# Patient Record
Sex: Female | Born: 1945 | Race: White | Hispanic: No | Marital: Married | Smoking: Former smoker
Health system: Southern US, Community
[De-identification: ages and names within clinical notes are randomized; demographics above are authoritative.]

## PROBLEM LIST (undated history)

## (undated) DIAGNOSIS — G629 Polyneuropathy, unspecified: Secondary | ICD-10-CM

## (undated) DIAGNOSIS — M797 Fibromyalgia: Secondary | ICD-10-CM

## (undated) DIAGNOSIS — E782 Mixed hyperlipidemia: Secondary | ICD-10-CM

## (undated) DIAGNOSIS — D649 Anemia, unspecified: Secondary | ICD-10-CM

## (undated) DIAGNOSIS — R011 Cardiac murmur, unspecified: Secondary | ICD-10-CM

## (undated) DIAGNOSIS — K219 Gastro-esophageal reflux disease without esophagitis: Secondary | ICD-10-CM

## (undated) DIAGNOSIS — I10 Essential (primary) hypertension: Secondary | ICD-10-CM

## (undated) DIAGNOSIS — I5189 Other ill-defined heart diseases: Secondary | ICD-10-CM

## (undated) DIAGNOSIS — J45909 Unspecified asthma, uncomplicated: Secondary | ICD-10-CM

## (undated) DIAGNOSIS — I6522 Occlusion and stenosis of left carotid artery: Secondary | ICD-10-CM

## (undated) DIAGNOSIS — R0600 Dyspnea, unspecified: Secondary | ICD-10-CM

## (undated) DIAGNOSIS — G4730 Sleep apnea, unspecified: Secondary | ICD-10-CM

## (undated) DIAGNOSIS — E119 Type 2 diabetes mellitus without complications: Secondary | ICD-10-CM

## (undated) DIAGNOSIS — R0609 Other forms of dyspnea: Secondary | ICD-10-CM

## (undated) DIAGNOSIS — G9050 Complex regional pain syndrome I, unspecified: Secondary | ICD-10-CM

## (undated) HISTORY — PX: BACK SURGERY: SHX140

## (undated) HISTORY — PX: FRACTURE SURGERY: SHX138

## (undated) HISTORY — PX: CARPAL TUNNEL RELEASE: SHX101

## (undated) HISTORY — PX: ABDOMINAL HYSTERECTOMY: SHX81

## (undated) HISTORY — PX: ANKLE FRACTURE SURGERY: SHX122

## (undated) HISTORY — PX: COLONOSCOPY: SHX174

## (undated) HISTORY — PX: KNEE ARTHROSCOPY: SHX127

## (undated) HISTORY — PX: CATARACT EXTRACTION, BILATERAL: SHX1313

---

## 2016-07-20 HISTORY — PX: FEMUR FRACTURE SURGERY: SHX633

## 2017-01-01 ENCOUNTER — Other Ambulatory Visit (HOSPITAL_COMMUNITY): Payer: Self-pay | Admitting: Interventional Radiology

## 2017-01-01 DIAGNOSIS — M4850XA Collapsed vertebra, not elsewhere classified, site unspecified, initial encounter for fracture: Principal | ICD-10-CM

## 2017-01-01 DIAGNOSIS — IMO0001 Reserved for inherently not codable concepts without codable children: Secondary | ICD-10-CM

## 2017-01-06 ENCOUNTER — Ambulatory Visit (HOSPITAL_COMMUNITY): Admission: RE | Admit: 2017-01-06 | Payer: Medicare Other | Source: Ambulatory Visit

## 2017-01-07 ENCOUNTER — Other Ambulatory Visit (HOSPITAL_COMMUNITY): Payer: Self-pay | Admitting: Interventional Radiology

## 2017-01-07 ENCOUNTER — Ambulatory Visit (HOSPITAL_COMMUNITY)
Admission: RE | Admit: 2017-01-07 | Discharge: 2017-01-07 | Disposition: A | Discharge status: Home / Self Care | Payer: Medicare Other | Source: Ambulatory Visit | Attending: Interventional Radiology | Admitting: Interventional Radiology

## 2017-01-07 DIAGNOSIS — M4850XA Collapsed vertebra, not elsewhere classified, site unspecified, initial encounter for fracture: Principal | ICD-10-CM

## 2017-01-07 DIAGNOSIS — IMO0001 Reserved for inherently not codable concepts without codable children: Secondary | ICD-10-CM

## 2017-01-07 HISTORY — PX: IR RADIOLOGIST EVAL & MGMT: IMG5224

## 2017-01-08 ENCOUNTER — Other Ambulatory Visit (HOSPITAL_COMMUNITY): Payer: Self-pay | Admitting: Specialist

## 2017-01-12 ENCOUNTER — Encounter (HOSPITAL_COMMUNITY): Payer: Self-pay | Admitting: Interventional Radiology

## 2017-01-13 ENCOUNTER — Other Ambulatory Visit: Payer: Self-pay | Admitting: Radiology

## 2017-01-14 ENCOUNTER — Other Ambulatory Visit (HOSPITAL_COMMUNITY): Payer: Self-pay | Admitting: Interventional Radiology

## 2017-01-14 ENCOUNTER — Ambulatory Visit (HOSPITAL_COMMUNITY)
Admission: RE | Admit: 2017-01-14 | Discharge: 2017-01-14 | Disposition: A | Discharge status: Home / Self Care | Payer: Medicare Other | Source: Ambulatory Visit | Attending: Interventional Radiology | Admitting: Interventional Radiology

## 2017-01-14 ENCOUNTER — Encounter (HOSPITAL_COMMUNITY): Payer: Self-pay

## 2017-01-14 DIAGNOSIS — M879 Osteonecrosis, unspecified: Secondary | ICD-10-CM | POA: Insufficient documentation

## 2017-01-14 DIAGNOSIS — M4850XA Collapsed vertebra, not elsewhere classified, site unspecified, initial encounter for fracture: Principal | ICD-10-CM

## 2017-01-14 DIAGNOSIS — M797 Fibromyalgia: Secondary | ICD-10-CM | POA: Diagnosis not present

## 2017-01-14 DIAGNOSIS — Z87891 Personal history of nicotine dependence: Secondary | ICD-10-CM | POA: Insufficient documentation

## 2017-01-14 DIAGNOSIS — G9050 Complex regional pain syndrome I, unspecified: Secondary | ICD-10-CM | POA: Insufficient documentation

## 2017-01-14 DIAGNOSIS — IMO0001 Reserved for inherently not codable concepts without codable children: Secondary | ICD-10-CM

## 2017-01-14 DIAGNOSIS — Z7951 Long term (current) use of inhaled steroids: Secondary | ICD-10-CM | POA: Insufficient documentation

## 2017-01-14 DIAGNOSIS — M4854XA Collapsed vertebra, not elsewhere classified, thoracic region, initial encounter for fracture: Secondary | ICD-10-CM | POA: Insufficient documentation

## 2017-01-14 DIAGNOSIS — Z7984 Long term (current) use of oral hypoglycemic drugs: Secondary | ICD-10-CM | POA: Insufficient documentation

## 2017-01-14 DIAGNOSIS — E1140 Type 2 diabetes mellitus with diabetic neuropathy, unspecified: Secondary | ICD-10-CM | POA: Insufficient documentation

## 2017-01-14 HISTORY — DX: Fibromyalgia: M79.7

## 2017-01-14 HISTORY — PX: IR VERTEBROPLASTY CERV/THOR BX INC UNI/BIL INC/INJECT/IMAGING: IMG5515

## 2017-01-14 HISTORY — DX: Complex regional pain syndrome I, unspecified: G90.50

## 2017-01-14 HISTORY — DX: Type 2 diabetes mellitus without complications: E11.9

## 2017-01-14 HISTORY — PX: IR VERTEBROPLASTY EA ADDL (T&LS) BX INC UNI/BIL INC INJECT/IMAGING: IMG5517

## 2017-01-14 HISTORY — DX: Polyneuropathy, unspecified: G62.9

## 2017-01-14 LAB — BASIC METABOLIC PANEL
ANION GAP: 8 (ref 5–15)
BUN: 23 mg/dL — AB (ref 6–20)
CHLORIDE: 103 mmol/L (ref 101–111)
CO2: 26 mmol/L (ref 22–32)
Calcium: 9.2 mg/dL (ref 8.9–10.3)
Creatinine, Ser: 1.51 mg/dL — ABNORMAL HIGH (ref 0.44–1.00)
GFR calc Af Amer: 39 mL/min — ABNORMAL LOW (ref >60)
GFR, EST NON AFRICAN AMERICAN: 33 mL/min — AB (ref >60)
GLUCOSE: 124 mg/dL — AB (ref 65–99)
POTASSIUM: 4.4 mmol/L (ref 3.5–5.1)
Sodium: 137 mmol/L (ref 135–145)

## 2017-01-14 LAB — APTT: APTT: 31 s (ref 24–36)

## 2017-01-14 LAB — CBC
HCT: 35.7 % — ABNORMAL LOW (ref 36.0–46.0)
HEMOGLOBIN: 11.6 g/dL — AB (ref 12.0–15.0)
MCH: 30.4 pg (ref 26.0–34.0)
MCHC: 32.5 g/dL (ref 30.0–36.0)
MCV: 93.7 fL (ref 78.0–100.0)
PLATELETS: 260 10*3/uL (ref 150–400)
RBC: 3.81 MIL/uL — ABNORMAL LOW (ref 3.87–5.11)
RDW: 13 % (ref 11.5–15.5)
WBC: 10.2 10*3/uL (ref 4.0–10.5)

## 2017-01-14 LAB — PROTIME-INR
INR: 0.98
PROTHROMBIN TIME: 13 s (ref 11.4–15.2)

## 2017-01-14 MED ORDER — CEFAZOLIN SODIUM-DEXTROSE 2-4 GM/100ML-% IV SOLN
2.0000 g | INTRAVENOUS | Status: AC
  Administered 2017-01-14: 2 g via INTRAVENOUS

## 2017-01-14 MED ORDER — BUPIVACAINE HCL (PF) 0.25 % IJ SOLN
INTRAMUSCULAR | Status: DC | PRN
  Administered 2017-01-14: 20 mL

## 2017-01-14 MED ORDER — MIDAZOLAM HCL 2 MG/2ML IJ SOLN
INTRAMUSCULAR | Status: AC
  Filled 2017-01-14: qty 4

## 2017-01-14 MED ORDER — MIDAZOLAM HCL 2 MG/2ML IJ SOLN
INTRAMUSCULAR | Status: DC | PRN
  Administered 2017-01-14: 1 mg via INTRAVENOUS

## 2017-01-14 MED ORDER — SODIUM CHLORIDE 0.9 % IV SOLN
INTRAVENOUS | Status: DC | PRN
  Administered 2017-01-14: 10 mL/h via INTRAVENOUS

## 2017-01-14 MED ORDER — TOBRAMYCIN SULFATE 1.2 G IJ SOLR
INTRAMUSCULAR | Status: DC | PRN
  Administered 2017-01-14: .01 g via TOPICAL

## 2017-01-14 MED ORDER — IOPAMIDOL (ISOVUE-300) INJECTION 61%
INTRAVENOUS | Status: AC
  Administered 2017-01-14: 4 mL
  Filled 2017-01-14: qty 50

## 2017-01-14 MED ORDER — SODIUM CHLORIDE 0.9 % IV SOLN: INTRAVENOUS | Status: AC

## 2017-01-14 MED ORDER — GELATIN ABSORBABLE 12-7 MM EX MISC
CUTANEOUS | Status: AC
  Filled 2017-01-14: qty 1

## 2017-01-14 MED ORDER — FENTANYL CITRATE (PF) 100 MCG/2ML IJ SOLN
INTRAMUSCULAR | Status: DC | PRN
  Administered 2017-01-14: 25 ug via INTRAVENOUS

## 2017-01-14 MED ORDER — SODIUM CHLORIDE 0.9 % IV SOLN: INTRAVENOUS | Status: DC

## 2017-01-14 MED ORDER — HYDROMORPHONE HCL 1 MG/ML IJ SOLN
INTRAMUSCULAR | Status: AC
  Filled 2017-01-14: qty 1

## 2017-01-14 MED ORDER — GELATIN ABSORBABLE 12-7 MM EX MISC
CUTANEOUS | Status: DC | PRN
  Administered 2017-01-14: 1 via TOPICAL

## 2017-01-14 MED ORDER — TOBRAMYCIN SULFATE 1.2 G IJ SOLR
INTRAMUSCULAR | Status: AC
  Filled 2017-01-14: qty 1.2

## 2017-01-14 MED ORDER — ALTEPLASE 2 MG IJ SOLR
INTRAMUSCULAR | Status: AC
  Filled 2017-01-14: qty 2

## 2017-01-14 MED ORDER — FENTANYL CITRATE (PF) 100 MCG/2ML IJ SOLN
INTRAMUSCULAR | Status: AC
  Filled 2017-01-14: qty 4

## 2017-01-14 MED ORDER — BUPIVACAINE HCL (PF) 0.25 % IJ SOLN
INTRAMUSCULAR | Status: AC
  Filled 2017-01-14: qty 30

## 2017-01-14 NOTE — Discharge Instructions (Addendum)
Percutaneous Vertebroplasty, Care After Refer to this sheet in the next few weeks. These instructions provide you with information on caring for yourself after your procedure. Your health care provider may also give you more specific instructions. Your treatment has been planned according to current medical practices, but problems sometimes occur. Call your health care provider if you have any problems or questions after your procedure. What can I expect after the procedure?  You may have discomfort in your back at the site of the procedure.  You may have immediate relief of the back pain from the collapsed bones of the spine (compression fracture), or it may lessen over the next 2 days.  Pain resulting from a percutaneous vertebroplasty will usually go away in 2-3 days. Follow these instructions at home:  Take pain medicine as directed by your health care provider.  Keep the incision site dry and covered for 24 hours, or as told by your health care provider. Ask your health care provider when you may remove your bandage, as well as when you can bathe or shower.  An ice pack can be placed on the incision site to help lessen pain and swelling after the procedure: ? Put ice in a plastic bag. ? Place a towel between your skin and the bag. ? Leave the ice on for 20 minutes, 2-3 times a day.  Rest for 24 hours after the procedure or as told by your health care provider.  Return to normal activity as told by your health care provider.  Ask what type of stretching and strengthening exercises you should do after the procedure.  Do not bend or lift anything heavy. Follow your health care provider's instructions about bending and lifting. Contact a health care provider if:  Your incision site becomes red, swollen, or tender to the touch.  You have any type of bleeding or fluid leakage from the incision site.  You become nauseous or vomit for more than 24 hours after the procedure.  Your  back pain does not get better.  You have a fever. Get help right away if:  You have sudden, severe back pain.  You notice a change in your ability to control urination or bowel movements.  You have tingling, numbness, or weakness in your legs or feet after the procedure that was not there before.  You have sudden weakness in your arms or legs.  You have shooting pain down your legs.  You have chest pain, trouble breathing, or are short of breath.  You feel dizzy or faint.  You have changes in your speech or vision. This information is not intended to replace advice given to you by your health care provider. Make sure you discuss any questions you have with your health care provider. Document Released: 03/05/2011 Document Revised: 12/12/2015 Document Reviewed: 03/13/2013 Elsevier Interactive Patient Education  2017 [place].   Balloon Kyphoplasty, Care After Refer to this sheet in the next few weeks. These instructions provide you with information about caring for yourself after your procedure. Your health care provider may also give you more specific instructions. Your treatment has been planned according to current medical practices, but problems sometimes occur. Call your health care provider if you have any problems or questions after your procedure. What can I expect after the procedure? After your procedure, it is common to have back pain. Follow these instructions at home: Incision care  Follow instructions from your health care provider about how to take care of your incisions. Make  sure you: ? Wash your hands with soap and water before you change your bandage (dressing). If soap and water are not available, use hand sanitizer. ? Change your dressing as told by your health care provider. ? Leave stitches (sutures), skin glue, or adhesive strips in place. These skin closures may need to be in place for 2 weeks or longer. If adhesive strip edges start to loosen and curl  up, you may trim the loose edges. Do not remove adhesive strips completely unless your health care provider tells you to do that.  Check your incision area every day for signs of infection. Watch for: ? Redness, swelling, or pain. ? Fluid, blood, or pus.  Keep your dressing dry until your health care provider says that it can be removed. Activity   Rest your back and avoid intense physical activity for as long as told by your health care provider.  Return to your normal activities as told by your health care provider. Ask your health care provider what activities are safe for you.  Do not lift anything that is heavier than 10 lb (4.5 kg). This is about the weight of a gallon of milk.You may need to avoid heavy lifting for several weeks. General instructions  Take over-the-counter and prescription medicines only as told by your health care provider.  If directed, apply ice to the painful area: ? Put ice in a plastic bag. ? Place a towel between your skin and the bag. ? Leave the ice on for 20 minutes, 2-3 times per day.  Do not use tobacco products, including cigarettes, chewing tobacco, or e-cigarettes. If you need help quitting, ask your health care provider.  Keep all follow-up visits as told by your health care provider. This is important. Contact a health care provider if:  You have a fever.  You have redness, swelling, or pain at the site of your incisions.  You have fluid, blood, or pus coming from your incisions.  You have pain that gets worse or does not get better with medicine.  You develop numbness or weakness in any part of your body. Get help right away if:  You have chest pain.  You have difficulty breathing.  You cannot move your legs.  You cannot control your bladder or bowel movements.  You suddenly become weak or numb on one side of your body.  You become very confused.  You have trouble speaking or understanding, or both. This information is  not intended to replace advice given to you by your health care provider. Make sure you discuss any questions you have with your health care provider. Document Released: 03/27/2015 Document Revised: 12/12/2015 Document Reviewed: 10/29/2014 Elsevier Interactive Patient Education  2018 [place]. Moderate Conscious Sedation, Adult, Care After These instructions provide you with information about caring for yourself after your procedure. Your health care provider may also give you more specific instructions. Your treatment has been planned according to current medical practices, but problems sometimes occur. Call your health care provider if you have any problems or questions after your procedure. What can I expect after the procedure? After your procedure, it is common:  To feel sleepy for several hours.  To feel clumsy and have poor balance for several hours.  To have poor judgment for several hours.  To vomit if you eat too soon.  Follow these instructions at home: For at least 24 hours after the procedure:   Do not: ? Participate in activities where you could fall or  become injured. ? Drive. ? Use heavy machinery. ? Drink alcohol. ? Take sleeping pills or medicines that cause drowsiness. ? Make important decisions or sign legal documents. ? Take care of children on your own.  Rest. Eating and drinking  Follow the diet recommended by your health care provider.  If you vomit: ? Drink water, juice, or soup when you can drink without vomiting. ? Make sure you have little or no nausea before eating solid foods. General instructions  Have a responsible adult stay with you until you are awake and alert.  Take over-the-counter and prescription medicines only as told by your health care provider.  If you smoke, do not smoke without supervision.  Keep all follow-up visits as told by your health care provider. This is important. Contact a health care provider if:  You keep  feeling nauseous or you keep vomiting.  You feel light-headed.  You develop a rash.  You have a fever. Get help right away if:  You have trouble breathing. This information is not intended to replace advice given to you by your health care provider. Make sure you discuss any questions you have with your health care provider. Document Released: 04/26/2013 Document Revised: 12/09/2015 Document Reviewed: 10/26/2015 Elsevier Interactive Patient Education  [employer].

## 2017-01-14 NOTE — Procedures (Signed)
S/P T4 and T5 VP  

## 2017-01-14 NOTE — H&P (Signed)
Chief Complaint: Patient was seen in consultation today for T4 compression fracture  Referring Physician(s):  Dr. Tonita Cong  Supervising Physician: Luanne Bras  Patient Status: [facility] - In-pt  History of Present Illness: Danielle Fuller is a 71 y.o. female with past medical history of DM, neuropathy, and fibromyalgia who complains of back pain.   Patient underwent MRI which showed: presence of signal changes at T4 consistent with acute to subacute compression deformity with the loss of approximately 30% of the height.  Patient was evaluated by Dr. Estanislado Pandy for possible vertebroplasty/kyphoplasty and is deemed an appropriate candidate.   Patient has been NPO.  She does not take blood thinners.  She has been in her usual state of health.   Past Medical History:  Diagnosis Date  . Diabetes mellitus without complication ([place])   . Fibromyalgia   . Neuropathy   . RSD (reflex sympathetic dystrophy)     Past Surgical History:  Procedure Laterality Date  . ANKLE FRACTURE SURGERY    . CARPAL TUNNEL RELEASE    . IR RADIOLOGIST EVAL & MGMT  01/07/2017  . KNEE ARTHROSCOPY      Allergies: Patient has no known allergies.  Medications: Prior to Admission medications   Medication Sig Start Date End Date Taking? Authorizing Provider  baclofen (LIORESAL) 20 MG tablet Take 20 mg by mouth 3 (three) times daily as needed for muscle spasms.   Yes [provider]  calcitonin, salmon, (MIACALCIN/FORTICAL) 200 UNIT/ACT nasal spray Place 1 spray into alternate nostrils daily.   Yes [provider]  colesevelam (WELCHOL) 625 MG tablet Take 1,875 mg by mouth 2 (two) times daily with a meal.   Yes [provider]  Cyanocobalamin (B-12 COMPLIANCE INJECTION) 1000 MCG/ML KIT Inject 1 mL as directed every 30 (thirty) days.   Yes [provider]  dicyclomine (BENTYL) 20 MG tablet Take 20 mg by mouth 3 (three) times daily before meals.   Yes [provider]  fluticasone (FLONASE) 50 MCG/ACT nasal spray Place 2 sprays into both nostrils daily.   Yes [provider]  magnesium oxide (MAG-OX) 400 MG tablet Take 400 mg by mouth 2 (two) times daily.   Yes [provider]  metFORMIN (GLUCOPHAGE-XR) 500 MG 24 hr tablet Take 500-1,000 mg by mouth daily. Take 500 mg every morning  Take 1000 mg every evening   Yes [provider]  methadone (DOLOPHINE) 5 MG tablet Take 5 mg by mouth 5 (five) times daily.   Yes [provider]  promethazine (PHENERGAN) 25 MG tablet Take 25 mg by mouth 3 (three) times daily as needed for nausea or vomiting.   Yes [provider]  ranitidine (ZANTAC) 150 MG tablet Take 150 mg by mouth 2 (two) times daily.   Yes [provider]  traZODone (DESYREL) 100 MG tablet Take 100 mg by mouth at bedtime.   Yes [provider]     Family History  Problem Relation Age of Onset  . Stroke Mother   . Heart disease Father     Social History   Social History  . Marital status: Single    Spouse name: N/A  . Number of children: N/A  . Years of education: N/A   Social History Main Topics  . Smoking status: Former Smoker    Types: Cigarettes  . Smokeless tobacco: Never Used  . Alcohol use No  . Drug use: No  . Sexual activity: Not Asked   Other Topics Concern  .  None   Social History Narrative  . None    Review of Systems  Constitutional: Negative for fatigue and fever.  Respiratory: Negative for cough and shortness of breath.   Cardiovascular: Negative for chest pain.  Psychiatric/Behavioral: Negative for behavioral problems and confusion.    Vital Signs: BP (!) 128/98   Pulse 67   Temp 98.3 F (36.8 C) (Oral)   Resp 16   Ht _0  (1.448 m)   Wt 115 lb (52.2 kg)   SpO2 95%   BMI 24.89 kg/m   Physical Exam  Constitutional: She is oriented to person, place, and time. She appears well-developed.  Cardiovascular: Normal rate, regular rhythm and  normal heart sounds.   Pulmonary/Chest: Effort normal and breath sounds normal. No respiratory distress.  Musculoskeletal:  Back pain at the level of her shoulder blades.   Neurological: She is alert and oriented to person, place, and time.  Skin: Skin is warm and dry.  Psychiatric: She has a normal mood and affect. Her behavior is normal. Judgment and thought content normal.  Nursing note and vitals reviewed.   Mallampati Score:  MD Evaluation Airway: WNL Heart: WNL Abdomen: WNL Chest/ Lungs: WNL ASA  Classification: 3 Mallampati/Airway Score: Two  Imaging: Ir Radiologist Eval & Mgmt  Result Date: 01/12/2017 EXAM: NEW PATIENT OFFICE VISIT CHIEF COMPLAINT: Severe thoracic pain for 3 months. Left leg pain for about a month and a half. Current Pain Level: 1-10 HISTORY OF PRESENT ILLNESS: The patient is a 71 year old right-handed lady who has been referred for evaluation of pain relief due to compression fracture at T4. The patient is accompanied by her husband. The patient reports a 3 month history of progressively worsening pain which apparently appeared overnight. No history of trauma, lifting or bending. However, she does admit to rigorous coughing at the time which could have precipitated the pain at that time due to the subsequently discovered fracture at T4. The patient reports her pain to be at least a 10 out of 10 which is constant burning in nature and occasional circumferential radiation. The patient is unable sleep at night because of the severe pain. The patient also complains of a pain in the left hip which radiates on the outer aspect of the thigh and subsequently to the level of the lateral aspect of the left ankle. She describes this as a burning pain which is intermittent. She denies any associated weakness of her lower extremities. She denies any autonomic dysfunction of her bowel or bladder activities. Her weight has been steady.  Her appetite is near normal. The patient is  barely able to ambulate because of severe pain in the thoracic region, and also due to the recent left lower extremity pain. Her pain in the thoracic region is worsened by coughing or sneezing or stooping forward. She reports using a brace which apparently prevents her from stooping and prevents further exacerbation, although there is no change in the intensity of the baseline pain of the 10 out of 10. Patient reports no recent symptoms of the rigors, fevers or chills. She denies any UTI symptoms of dysuria, frequency or hematuria. Past Medical History: Arthritis, diabetes mellitus, reflux indigestion. Previous Surgical History: Ankle surgery bilateral, carpal tunnel repair, cataract surgery on the left, D&C, foot surgery on the left, hemorrhoidectomy, hysterectomy. Diagnosis of reflex sympathetic dystrophy in both upper extremities. Medications: Liquid Narcan, Premarin, Mirapex, Advair Diskus, Zantac, Welchol, Macrodantin, baclofen, cyanocobalamin, tizanidine, magnesium oxide, promethazine, dicyclomine, aspirin 81 mg a day, methadone, fluticasone,  metformin, trazodone, meloxicam, oxycodone as needed, Allergies: No known allergies. Social History: The patient is presently unable to perform her normal activities on account of her severe pain. She denies drinking alcohol or using illicit chemicals. Denies smoking. Family History: Family history of diabetes, headaches, heart problems, high blood pressure. REVIEW OF SYSTEMS: Negative unless as mentioned above. PHYSICAL EXAMINATION: Appears in obvious distress on account of severe pain in the thoracic regions. Affect appropriate to the situation. Neurologically, the patient demonstrates no lateralizing cranial nerve, motor, sensory or coordination difficulties. Station and gait not tested. Patient exhibited severe focal tenderness to palpation in the midline at the T4-T5 level, and to a lesser degree at the T10-T11 levels. ASSESSMENT AND PLAN: The patient's recent MRI of  the thoracic and lumbar spine were reviewed. These depict the presence of signal changes at T4 consistent with acute to subacute compression deformity with the loss of approximately 30% of the height. There is also a vertebra plana at T5 with moderate retropulsion into the spinal canal. No contact with the adjacent spinal cord, however, is noted. No other acute appearing fractures are seen in the thoracic or the lumbosacral spine region. The option of vertebral body augmentation to prevent further collapse, and to alleviate pain was presented. The procedure, the risks, the benefits and alternatives were all reviewed. Questions were answered to their satisfaction. The patient and her husband would like to proceed with the vertebral body augmentation at T4. This will be scheduled at the earliest possible. In the meantime, the patient has been asked to continue using her walker and to avoid stooping, bending or lifting anything above 10 pounds. She and her husband were asked to call should they have any concerns or questions. They leave with good understanding and agreement with the above management plan. Electronically Signed   By: Luanne Bras M.D.   On: 01/07/2017 12:43    Labs:  CBC:  Recent Labs  01/14/17 0648  WBC 10.2  HGB 11.6*  HCT 35.7*  PLT 260    COAGS:  Recent Labs  01/14/17 0648  INR 0.98  APTT 31    BMP:  Recent Labs  01/14/17 0648  NA 137  K 4.4  CL 103  CO2 26  GLUCOSE 124*  BUN 23*  CALCIUM 9.2  CREATININE 1.51*  GFRNONAA 33*  GFRAA 39*    LIVER FUNCTION TESTS: No results for input(s): BILITOT, AST, ALT, ALKPHOS, PROT, ALBUMIN in the last 8760 hours.  TUMOR MARKERS: No results for input(s): AFPTM, CEA, CA199, CHROMGRNA in the last 8760 hours.  Assessment and Plan: Patient with T4 compression fracture presents to radiology department for verteroplasty/kyphoplasty at the request of Dr. Tonita Cong.  Patient has seen Dr. Estanislado Pandy in clinic and has been  deemed an appropriate candidate.  Patient has been NPO.  She has been in her usual state of health.  She does not take blood thinners.  Risks and Benefits discussed with the patient including, but not limited to education regarding the natural healing process of compression fractures without intervention, bleeding, infection, cement migration which may cause spinal cord damage, paralysis, pulmonary embolism or even death. All of the patient's questions were answered, patient is agreeable to proceed. Consent signed and in chart.  Thank you for this interesting consult.  I greatly enjoyed meeting ASHLYN CABLER and look forward to participating in their care.  A copy of this report was sent to the requesting provider on this date.  Electronically Signed: Docia Barrier, PA  01/14/2017, 8:05 AM   I spent a total of    15 Minutes in face to face in clinical consultation, greater than 50% of which was counseling/coordinating care for T4 compression fracture.

## 2017-01-18 ENCOUNTER — Telehealth (HOSPITAL_COMMUNITY): Payer: Self-pay

## 2017-01-18 ENCOUNTER — Encounter (HOSPITAL_COMMUNITY): Payer: Self-pay | Admitting: Interventional Radiology

## 2017-01-18 NOTE — Telephone Encounter (Signed)
Called to schedule f/u, left message for pt to return call. AW 

## 2017-01-22 ENCOUNTER — Other Ambulatory Visit (HOSPITAL_COMMUNITY): Payer: Self-pay | Admitting: Interventional Radiology

## 2017-01-22 DIAGNOSIS — S22000A Wedge compression fracture of unspecified thoracic vertebra, initial encounter for closed fracture: Secondary | ICD-10-CM

## 2017-02-02 ENCOUNTER — Ambulatory Visit (HOSPITAL_COMMUNITY)
Admission: RE | Admit: 2017-02-02 | Discharge: 2017-02-02 | Disposition: A | Discharge status: Home / Self Care | Payer: Medicare Other | Source: Ambulatory Visit | Attending: Interventional Radiology | Admitting: Interventional Radiology

## 2017-02-02 DIAGNOSIS — S22000A Wedge compression fracture of unspecified thoracic vertebra, initial encounter for closed fracture: Secondary | ICD-10-CM

## 2017-02-02 HISTORY — PX: IR RADIOLOGIST EVAL & MGMT: IMG5224

## 2017-02-03 ENCOUNTER — Encounter (HOSPITAL_COMMUNITY): Payer: Self-pay | Admitting: Interventional Radiology

## 2017-02-08 ENCOUNTER — Telehealth (HOSPITAL_COMMUNITY): Payer: Self-pay

## 2017-02-08 NOTE — Telephone Encounter (Signed)
Called to schedule mri, left message for pt to return call. AW

## 2017-02-09 ENCOUNTER — Other Ambulatory Visit (HOSPITAL_COMMUNITY): Payer: Self-pay | Admitting: Interventional Radiology

## 2017-02-09 DIAGNOSIS — IMO0001 Reserved for inherently not codable concepts without codable children: Secondary | ICD-10-CM

## 2017-02-09 DIAGNOSIS — M4850XA Collapsed vertebra, not elsewhere classified, site unspecified, initial encounter for fracture: Principal | ICD-10-CM

## 2017-02-15 ENCOUNTER — Ambulatory Visit (HOSPITAL_COMMUNITY)
Admission: RE | Admit: 2017-02-15 | Discharge: 2017-02-15 | Disposition: A | Discharge status: Home / Self Care | Payer: Medicare Other | Source: Ambulatory Visit | Attending: Interventional Radiology | Admitting: Interventional Radiology

## 2017-02-15 DIAGNOSIS — R600 Localized edema: Secondary | ICD-10-CM | POA: Insufficient documentation

## 2017-02-15 DIAGNOSIS — IMO0001 Reserved for inherently not codable concepts without codable children: Secondary | ICD-10-CM

## 2017-02-15 DIAGNOSIS — M4850XA Collapsed vertebra, not elsewhere classified, site unspecified, initial encounter for fracture: Secondary | ICD-10-CM

## 2017-02-15 DIAGNOSIS — M4854XA Collapsed vertebra, not elsewhere classified, thoracic region, initial encounter for fracture: Secondary | ICD-10-CM | POA: Diagnosis not present

## 2017-02-23 ENCOUNTER — Other Ambulatory Visit (HOSPITAL_COMMUNITY): Payer: Self-pay | Admitting: Interventional Radiology

## 2017-02-25 ENCOUNTER — Other Ambulatory Visit (HOSPITAL_COMMUNITY): Payer: Self-pay | Admitting: Interventional Radiology

## 2017-02-25 ENCOUNTER — Telehealth (HOSPITAL_COMMUNITY): Payer: Self-pay | Admitting: Radiology

## 2017-02-25 DIAGNOSIS — S22050A Wedge compression fracture of T5-T6 vertebra, initial encounter for closed fracture: Secondary | ICD-10-CM

## 2017-02-25 DIAGNOSIS — M546 Pain in thoracic spine: Secondary | ICD-10-CM

## 2017-02-25 NOTE — Telephone Encounter (Signed)
Called pt, left VM for her to call to schedule her T6 VP. JM

## 2017-02-26 ENCOUNTER — Observation Stay (HOSPITAL_COMMUNITY)
Admission: EM | Admit: 2017-02-26 | Discharge: 2017-02-27 | Disposition: A | Discharge status: Home / Self Care | Payer: Medicare Other | Attending: Internal Medicine | Admitting: Internal Medicine

## 2017-02-26 ENCOUNTER — Encounter (HOSPITAL_COMMUNITY): Payer: Self-pay | Admitting: Emergency Medicine

## 2017-02-26 ENCOUNTER — Telehealth (HOSPITAL_COMMUNITY): Payer: Self-pay | Admitting: Radiology

## 2017-02-26 ENCOUNTER — Emergency Department (HOSPITAL_COMMUNITY): Payer: Medicare Other

## 2017-02-26 ENCOUNTER — Other Ambulatory Visit: Payer: Self-pay | Admitting: Student

## 2017-02-26 ENCOUNTER — Other Ambulatory Visit: Payer: Self-pay | Admitting: Radiology

## 2017-02-26 DIAGNOSIS — G9340 Encephalopathy, unspecified: Secondary | ICD-10-CM

## 2017-02-26 DIAGNOSIS — R4182 Altered mental status, unspecified: Secondary | ICD-10-CM | POA: Diagnosis not present

## 2017-02-26 DIAGNOSIS — M40204 Unspecified kyphosis, thoracic region: Secondary | ICD-10-CM | POA: Diagnosis not present

## 2017-02-26 DIAGNOSIS — Z79899 Other long term (current) drug therapy: Secondary | ICD-10-CM | POA: Insufficient documentation

## 2017-02-26 DIAGNOSIS — Z87891 Personal history of nicotine dependence: Secondary | ICD-10-CM | POA: Diagnosis not present

## 2017-02-26 DIAGNOSIS — I6522 Occlusion and stenosis of left carotid artery: Secondary | ICD-10-CM | POA: Insufficient documentation

## 2017-02-26 DIAGNOSIS — G9341 Metabolic encephalopathy: Principal | ICD-10-CM

## 2017-02-26 DIAGNOSIS — M4854XA Collapsed vertebra, not elsewhere classified, thoracic region, initial encounter for fracture: Secondary | ICD-10-CM | POA: Diagnosis not present

## 2017-02-26 DIAGNOSIS — G9050 Complex regional pain syndrome I, unspecified: Secondary | ICD-10-CM | POA: Diagnosis not present

## 2017-02-26 DIAGNOSIS — M797 Fibromyalgia: Secondary | ICD-10-CM

## 2017-02-26 DIAGNOSIS — Z7984 Long term (current) use of oral hypoglycemic drugs: Secondary | ICD-10-CM | POA: Insufficient documentation

## 2017-02-26 DIAGNOSIS — I700 Atherosclerosis of aorta: Secondary | ICD-10-CM | POA: Diagnosis not present

## 2017-02-26 DIAGNOSIS — T40601A Poisoning by unspecified narcotics, accidental (unintentional), initial encounter: Secondary | ICD-10-CM | POA: Diagnosis not present

## 2017-02-26 DIAGNOSIS — E1140 Type 2 diabetes mellitus with diabetic neuropathy, unspecified: Secondary | ICD-10-CM | POA: Diagnosis not present

## 2017-02-26 LAB — I-STAT CHEM 8, ED
BUN: 24 mg/dL — ABNORMAL HIGH (ref 6–20)
CHLORIDE: 109 mmol/L (ref 101–111)
CREATININE: 1.4 mg/dL — AB (ref 0.44–1.00)
Calcium, Ion: 1.2 mmol/L (ref 1.15–1.40)
Glucose, Bld: 138 mg/dL — ABNORMAL HIGH (ref 65–99)
HEMATOCRIT: 33 % — AB (ref 36.0–46.0)
Hemoglobin: 11.2 g/dL — ABNORMAL LOW (ref 12.0–15.0)
Potassium: 3.8 mmol/L (ref 3.5–5.1)
Sodium: 141 mmol/L (ref 135–145)
TCO2: 23 mmol/L (ref 0–100)

## 2017-02-26 LAB — I-STAT VENOUS BLOOD GAS, ED
Acid-Base Excess: 1 mmol/L (ref 0.0–2.0)
BICARBONATE: 27 mmol/L (ref 20.0–28.0)
O2 Saturation: 45 %
PH VEN: 7.374 (ref 7.250–7.430)
PO2 VEN: 26 mmHg — AB (ref 32.0–45.0)
TCO2: 28 mmol/L (ref 0–100)
pCO2, Ven: 46.3 mmHg (ref 44.0–60.0)

## 2017-02-26 LAB — COMPREHENSIVE METABOLIC PANEL
ALBUMIN: 3.5 g/dL (ref 3.5–5.0)
ALT: 13 U/L — ABNORMAL LOW (ref 14–54)
AST: 15 U/L (ref 15–41)
Alkaline Phosphatase: 89 U/L (ref 38–126)
Anion gap: 9 (ref 5–15)
BUN: 23 mg/dL — AB (ref 6–20)
CHLORIDE: 108 mmol/L (ref 101–111)
CO2: 24 mmol/L (ref 22–32)
CREATININE: 1.29 mg/dL — AB (ref 0.44–1.00)
Calcium: 9.4 mg/dL (ref 8.9–10.3)
GFR calc Af Amer: 47 mL/min — ABNORMAL LOW (ref >60)
GFR calc non Af Amer: 40 mL/min — ABNORMAL LOW (ref >60)
Glucose, Bld: 148 mg/dL — ABNORMAL HIGH (ref 65–99)
POTASSIUM: 3.8 mmol/L (ref 3.5–5.1)
Sodium: 141 mmol/L (ref 135–145)
Total Bilirubin: 0.4 mg/dL (ref 0.3–1.2)
Total Protein: 7.1 g/dL (ref 6.5–8.1)

## 2017-02-26 LAB — CBG MONITORING, ED: Glucose-Capillary: 152 mg/dL — ABNORMAL HIGH (ref 65–99)

## 2017-02-26 LAB — CBC
HEMATOCRIT: 34.6 % — AB (ref 36.0–46.0)
Hemoglobin: 11.4 g/dL — ABNORMAL LOW (ref 12.0–15.0)
MCH: 29.8 pg (ref 26.0–34.0)
MCHC: 32.9 g/dL (ref 30.0–36.0)
MCV: 90.3 fL (ref 78.0–100.0)
PLATELETS: 291 10*3/uL (ref 150–400)
RBC: 3.83 MIL/uL — ABNORMAL LOW (ref 3.87–5.11)
RDW: 13.6 % (ref 11.5–15.5)
WBC: 7.7 10*3/uL (ref 4.0–10.5)

## 2017-02-26 LAB — I-STAT TROPONIN, ED: Troponin i, poc: 0 ng/mL (ref 0.00–0.08)

## 2017-02-26 LAB — I-STAT CG4 LACTIC ACID, ED: Lactic Acid, Venous: 0.58 mmol/L (ref 0.5–1.9)

## 2017-02-26 MED ORDER — IPRATROPIUM-ALBUTEROL 0.5-2.5 (3) MG/3ML IN SOLN
3.0000 mL | Freq: Once | RESPIRATORY_TRACT | Status: AC
  Administered 2017-02-26: 3 mL via RESPIRATORY_TRACT
  Filled 2017-02-26: qty 3

## 2017-02-26 MED ORDER — IOPAMIDOL (ISOVUE-370) INJECTION 76%
INTRAVENOUS | Status: AC
  Administered 2017-02-26: 50 mL
  Filled 2017-02-26: qty 50

## 2017-02-26 MED ORDER — NALOXONE HCL 0.4 MG/ML IJ SOLN
0.4000 mg | INTRAMUSCULAR | Status: DC | PRN
  Administered 2017-02-26: 0.2 mg via INTRAVENOUS
  Filled 2017-02-26: qty 1

## 2017-02-26 NOTE — ED Notes (Signed)
Check [facility] 152 RN Tori informed

## 2017-02-26 NOTE — ED Triage Notes (Signed)
Pt presents with husband, ever since 2pm pt has been very sleepy, will nod off in the middle of a conversation. Pt is A/O once she wakes up but continues to fall asleep in the middle of triage. Pt recently had surgery 3 weeks ago.

## 2017-02-26 NOTE — Consult Note (Addendum)
Neurology Consultation  Reason for Consult: AMS Referring Physician: Dr Ralene Bathe  CC: AMS  History is obtained from: Patient's husband  HPI: Danielle Fuller is a 71 y.o. female past medical history diabetes, fibromyalgia, neuropathy, osteoporosis, reflex sympathetic dystrophy, recent thoracic spine fracture status post vertebroplasty 3 weeks ago, who presents to the emergency room for evaluation of altered mental status. According to the husband, she was acting unlike herself ever since 2 PM. There have been no preceding illnesses or sicknesses. She had no trauma. She has been on methadone and opiates, for her multiple pain complaints. The husband was of the opinion that she has probably taken more than her prescribed pain medications. The reason for the neurology consult was that the husband told the emergency room providers that at some point she was seen at [facility] and was told that there is a clot in a vessel in the back of her head and there was concern for a posterior circulation stroke. The husband could not provide further details and no records were available from that facility.   LKW: 2 PM tpa given?: no, past window, likely opiate overdose and not stroke Premorbid modified Rankin scale (mRS): 2-Slight disability-UNABLE to perform all activities but does not need assistance    ROS: A 14 point ROS was performed and is negative except as noted in the HPI.   Past Medical History:  Diagnosis Date  . Diabetes mellitus without complication ([place])   . Fibromyalgia   . Neuropathy   . RSD (reflex sympathetic dystrophy)      Family History  Problem Relation Age of Onset  . Stroke Mother   . Heart disease Father     Social History:   reports that she has quit smoking. Her smoking use included Cigarettes. She has never used smokeless tobacco. She reports that she does not drink alcohol or use drugs.  Medications  Current Facility-Administered Medications:  .   ipratropium-albuterol (DUONEB) 0.5-2.5 (3) MG/3ML nebulizer solution 3 mL, 3 mL, Nebulization, Once, Quintella Reichert, MD .  naloxone [facility]) injection 0.4 mg, 0.4 mg, Intravenous, PRN, Amie Portland, MD  Current Outpatient Prescriptions:  .  baclofen (LIORESAL) 20 MG tablet, Take 20 mg by mouth 3 (three) times daily as needed for muscle spasms., Disp: , Rfl:  .  calcitonin, salmon, (MIACALCIN/FORTICAL) 200 UNIT/ACT nasal spray, Place 1 spray into alternate nostrils daily., Disp: , Rfl:  .  colesevelam (WELCHOL) 625 MG tablet, Take 1,875 mg by mouth 2 (two) times daily with a meal., Disp: , Rfl:  .  Cyanocobalamin (B-12 COMPLIANCE INJECTION) 1000 MCG/ML KIT, Inject 1 mL as directed every 30 (thirty) days., Disp: , Rfl:  .  dicyclomine (BENTYL) 20 MG tablet, Take 20 mg by mouth 3 (three) times daily before meals., Disp: , Rfl:  .  fluticasone (FLONASE) 50 MCG/ACT nasal spray, Place 2 sprays into both nostrils daily., Disp: , Rfl:  .  magnesium oxide (MAG-OX) 400 MG tablet, Take 400 mg by mouth 2 (two) times daily., Disp: , Rfl:  .  meloxicam (MOBIC) 15 MG tablet, Take 15 mg by mouth daily., Disp: , Rfl: 1 .  metFORMIN (GLUCOPHAGE-XR) 500 MG 24 hr tablet, Take 500-1,000 mg by mouth daily. Take 500 mg every morning  Take 1000 mg every evening, Disp: , Rfl:  .  methadone (DOLOPHINE) 5 MG tablet, Take 5 mg by mouth 5 (five) times daily., Disp: , Rfl:  .  Oxycodone HCl 10 MG TABS, Take 1 tablet by mouth  every 8 (eight) hours as needed., Disp: , Rfl: 0 .  promethazine (PHENERGAN) 25 MG tablet, Take 25 mg by mouth 3 (three) times daily as needed for nausea or vomiting., Disp: , Rfl:  .  ranitidine (ZANTAC) 150 MG tablet, Take 150 mg by mouth 2 (two) times daily., Disp: , Rfl:  .  traZODone (DESYREL) 100 MG tablet, Take 100 mg by mouth at bedtime., Disp: , Rfl:   Exam: Current vital signs: BP (!) 142/88 (BP Location: Left Arm)   Pulse 74   Temp 98.1 F (36.7 C) (Oral)   Resp 18   Ht 5' (1.524 m)    Wt 52.2 kg (115 lb)   SpO2 96%   BMI 22.46 kg/m   Vital signs in last 24 hours: Temp:  [98.1 F (36.7 C)] 98.1 F (36.7 C) (08/10 2152) Pulse Rate:  [74-76] 74 (08/10 2152) Resp:  [14-18] 18 (08/10 2152) BP: (142)/(88) 142/88 (08/10 2152) SpO2:  [94 %-96 %] 96 % (08/10 2152) Weight:  [52.2 kg (115 lb)] 52.2 kg (115 lb) (08/10 2147) Gen.: Drowsy, intermittently arousable by voice. No apparent distress. HEENT: Normocephalic, atraumatic, dry oral mucous membranes, lymphadenopathy, thyromegaly. Lungs: Wheezing on the right lower lungs, clear on the left. Cardiovascular: S1-S2 heard, regular rate rhythm, no murmur or gallop Abdomen: Nondistended nontender Extremities are warm well-perfused with no edema. Neurological exam Patient is drowsy, intermittently arousable by voice, follows some commands, on initial exam unable to name or follow commands consistently. After receiving Narcan, was able to name, repeat and competent 20. Cranial nerve exam: Pupils equal round reactive to light, gaze is slightly disconjugate, extraocular movements are intact, visual fields appear full to threat, face is symmetric although she is edentulous, auditory acuity decreased bilaterally. Motor exam: 5/5 all over, normal tone, normal bulk, normal range of motion. Sensory: Withdraws forcefully to noxious stimulus in all 4 extremities. Coordination: Did not cooperate with formal testing, no gross incoordination based on reach for objects. DTRs: 2+ all over Gait examination was deferred at this point   Labs I have reviewed labs in epic and the results pertinent to this consultation are:  CBC    Component Value Date/Time   WBC 10.2 01/14/2017 0648   RBC 3.81 (L) 01/14/2017 0648   HGB 11.2 (L) 02/26/2017 2203   HCT 33.0 (L) 02/26/2017 2203   PLT 260 01/14/2017 0648   MCV 93.7 01/14/2017 0648   MCH 30.4 01/14/2017 0648   MCHC 32.5 01/14/2017 0648   RDW 13.0 01/14/2017 0648    CMP     Component Value  Date/Time   NA 141 02/26/2017 2203   K 3.8 02/26/2017 2203   CL 109 02/26/2017 2203   CO2 26 01/14/2017 0648   GLUCOSE 138 (H) 02/26/2017 2203   BUN 24 (H) 02/26/2017 2203   CREATININE 1.40 (H) 02/26/2017 2203   CALCIUM 9.2 01/14/2017 0648   GFRNONAA 33 (L) 01/14/2017 0648   GFRAA 39 (L) 01/14/2017 7673    Imaging I have reviewed the images obtained: Noncontrast CT of the head does not show any acute of normality. CT angiogram of the head and neck reveals no large vessel occlusion including no basilar occlusion. Carotid stenoses bilaterally and some athero in the basilar but wide open. Head/neck CTA Imaging did reveal a new T6 compression fracture, which is new compared to June 2018 MRI. (reported by Dr. Dorann Lodge in the CTA report and communicated to me over phone)  Assessment:  71 year old woman past history of diabetes, fibromyalgia,  neuropathy, RSD, osteoporosis, recent thoracic spine fracture status post vertebroplasty 3 weeks ago presented to the emergency room for evaluation of altered mental status. Husband provided some history of a possible "back of the head artery/vein clot: In the past raising concern for a possible posterior circulation stroke. Patient's exam was nonfocal, though she was drowsy and intermittently following commands and would doze off while speaking. I gave the patient Narcan 0.2 IV 1 right after obtaining the CT of the head and her mental status improved considerably. CT of the head without contrast did not show any evidence of bleed. CTA of the head and neck did not reveal any large vessel occlusion, basilar artery patent. Incidental finding of T6 fracture.  Impression: Toxic encephalopathy-opiate overdose Evaluate for stroke T6 compression fracture  Recommendations -Given Narcan 1 with improvement in mental status -Obtain UDS -c/o chest pain, will recommend checking EKG and cardiac enzymes -Minimize opiates. Also on other medications that can cause  acute mental status changes such as baclofen, and I would recommend minimizing baclofen dosing as well. -Husband was counseled to use pillboxes/organizers to prevent future chances of overdosing. -Can obtain an MRI of the brain without contrast to definitively rule out stroke. -Unless the MRI shows evidence of stroke, I will not recommend pursuing any stroke risk factor workup. -Thoracic spine fracture management per primary/ortho/spine sx.  Please call with questions.    Amie Portland, MD [facility] [phone]  If 7pm to 7am, please call on call as listed on AMION.

## 2017-02-26 NOTE — ED Provider Notes (Addendum)
[facility] DEPT Provider Note   CSN: [id number] Arrival date & time: 02/26/17  2140     History   Chief Complaint Chief Complaint  Patient presents with  . Altered Mental Status    HPI Danielle Fuller is a 71 y.o. female.  The history is provided by the patient. No language interpreter was used.  Altered Mental Status     Danielle Fuller is a 71 y.o. female who presents to the Emergency Department complaining of AMS.  Level V caveat due to altered mental status.  History is provided by the patient's husband. He states that since 2:00 this afternoon when he picked her up from the baseline she has had change in mental status with her head dropping often and more confusion and change in speech. He states there've been no recent illnesses but she did have surgery on her back a few weeks ago. She takes methadone for chronic pain and she gives herself her medications. Patient reports chest pain and back pain. Past Medical History:  Diagnosis Date  . Diabetes mellitus without complication ([place])   . Fibromyalgia   . Neuropathy   . RSD (reflex sympathetic dystrophy)     There are no active problems to display for this patient.   Past Surgical History:  Procedure Laterality Date  . ANKLE FRACTURE SURGERY    . CARPAL TUNNEL RELEASE    . IR RADIOLOGIST EVAL & MGMT  01/07/2017  . IR RADIOLOGIST EVAL & MGMT  02/02/2017  . IR VERTEBROPLASTY CERV/THOR BX INC UNI/BIL INC/INJECT/IMAGING  01/14/2017  . IR VERTEBROPLASTY EA ADDL (T&LS) BX INC UNI/BIL INC INJECT/IMAGING  01/14/2017  . KNEE ARTHROSCOPY      OB History    No data available       Home Medications    Prior to Admission medications   Medication Sig Start Date End Date Taking? Authorizing Provider  baclofen (LIORESAL) 20 MG tablet Take 20 mg by mouth 3 (three) times daily as needed for muscle spasms.    [provider]  calcitonin, salmon, (MIACALCIN/FORTICAL) 200 UNIT/ACT nasal spray Place 1 spray into alternate  nostrils daily.    [provider]  colesevelam (WELCHOL) 625 MG tablet Take 1,875 mg by mouth 2 (two) times daily with a meal.    [provider]  Cyanocobalamin (B-12 COMPLIANCE INJECTION) 1000 MCG/ML KIT Inject 1 mL as directed every 30 (thirty) days.    [provider]  dicyclomine (BENTYL) 20 MG tablet Take 20 mg by mouth 3 (three) times daily before meals.    [provider]  fluticasone (FLONASE) 50 MCG/ACT nasal spray Place 2 sprays into both nostrils daily.    [provider]  magnesium oxide (MAG-OX) 400 MG tablet Take 400 mg by mouth 2 (two) times daily.    [provider]  meloxicam (MOBIC) 15 MG tablet Take 15 mg by mouth daily. 01/31/17   [provider]  metFORMIN (GLUCOPHAGE-XR) 500 MG 24 hr tablet Take 500-1,000 mg by mouth daily. Take 500 mg every morning  Take 1000 mg every evening    [provider]  methadone (DOLOPHINE) 5 MG tablet Take 5 mg by mouth 5 (five) times daily.    [provider]  Oxycodone HCl 10 MG TABS Take 1 tablet by mouth every 8 (eight) hours as needed. 02/08/17   [provider]  promethazine (PHENERGAN) 25 MG tablet Take 25 mg by mouth 3 (three) times daily as needed for nausea or vomiting.  [provider]  ranitidine (ZANTAC) 150 MG tablet Take 150 mg by mouth 2 (two) times daily.    [provider]  traZODone (DESYREL) 100 MG tablet Take 100 mg by mouth at bedtime.    [provider]    Family History Family History  Problem Relation Age of Onset  . Stroke Mother   . Heart disease Father     Social History Social History  Substance Use Topics  . Smoking status: Former Smoker    Types: Cigarettes  . Smokeless tobacco: Never Used  . Alcohol use No     Allergies   Patient has no known allergies.   Review of Systems Review of Systems  All other systems reviewed and are negative.    Physical Exam Updated Vital  Signs BP (!) 142/88 (BP Location: Left Arm)   Pulse 74   Temp 98.1 F (36.7 C) (Oral)   Resp 18   Ht 5' (1.524 m)   Wt 52.2 kg (115 lb)   SpO2 96%   BMI 22.46 kg/m   Physical Exam  Constitutional: She appears well-developed.  Frail, kyphotic  HENT:  Head: Normocephalic and atraumatic.  Cardiovascular: Normal rate and regular rhythm.   No murmur heard. Pulmonary/Chest: Effort normal. No respiratory distress.  Occasional and expiratory wheezes bilaterally  Abdominal: Soft. There is no tenderness. There is no rebound and no guarding.  Musculoskeletal: She exhibits no edema or tenderness.  Neurological:  Lethargic with dysarthric speech. Arousable to verbal stimuli. Intermittently flaccid in the left upper extremity. Difficulty following commands.  Skin: Skin is warm and dry.  Psychiatric: She has a normal mood and affect. Her behavior is normal.  Nursing note and vitals reviewed.    ED Treatments / Results  Labs (all labs ordered are listed, but only abnormal results are displayed) Labs Reviewed  COMPREHENSIVE METABOLIC PANEL - Abnormal; Notable for the following:       Result Value   Glucose, Bld 148 (*)    BUN 23 (*)    Creatinine, Ser 1.29 (*)    ALT 13 (*)    GFR calc non Af Amer 40 (*)    GFR calc Af Amer 47 (*)    All other components within normal limits  CBC - Abnormal; Notable for the following:    RBC 3.83 (*)    Hemoglobin 11.4 (*)    HCT 34.6 (*)    All other components within normal limits  CBG MONITORING, ED - Abnormal; Notable for the following:    Glucose-Capillary 152 (*)    All other components within normal limits  I-STAT VENOUS BLOOD GAS, ED - Abnormal; Notable for the following:    pO2, Ven 26.0 (*)    All other components within normal limits  I-STAT CHEM 8, ED - Abnormal; Notable for the following:    BUN 24 (*)    Creatinine, Ser 1.40 (*)    Glucose, Bld 138 (*)    Hemoglobin 11.2 (*)    HCT 33.0 (*)    All other components within  normal limits  URINALYSIS, ROUTINE W REFLEX MICROSCOPIC  I-STAT CG4 LACTIC ACID, ED  I-STAT TROPONIN, ED    EKG  EKG Interpretation None       Radiology Ct Angio Head W Or Wo Contrast  Result Date: 02/26/2017 CLINICAL DATA:  Somnolent. Assess for basilar artery thrombosis. Status post vertebral body cement augmentation January 14, 2017. EXAM: CT ANGIOGRAPHY HEAD AND NECK TECHNIQUE: Multidetector CT imaging of the  head and neck was performed using the standard protocol during bolus administration of intravenous contrast. Multiplanar CT image reconstructions and MIPs were obtained to evaluate the vascular anatomy. Carotid stenosis measurements (when applicable) are obtained utilizing NASCET criteria, using the distal internal carotid diameter as the denominator. CONTRAST:  50 cc Isovue 370 COMPARISON:  MRI thoracic spine December 19, 2016 FINDINGS: CT HEAD FINDINGS- mildly motion degraded examination. BRAIN: No intraparenchymal hemorrhage, mass effect nor midline shift. The ventricles and sulci are normal for age. Patchy supratentorial white matter hypodensities less than expected for patient's age, though non-specific are most compatible with chronic small vessel ischemic disease. No acute large vascular territory infarcts. No abnormal extra-axial fluid collections. Basal cisterns are patent. VASCULAR: Mild calcific atherosclerosis of the carotid siphons. SKULL: No skull fracture. No significant scalp soft tissue swelling. SINUSES/ORBITS: Trace paranasal sinus mucosal thickening. Mastoid air cells are well aerated.The included ocular globes and orbital contents are non-suspicious. Status post bilateral ocular lens implants. OTHER: None. CTA NECK AORTIC ARCH: Normal appearance of the thoracic arch, 2 vessel arch is a normal variant. Mild calcific atherosclerosis The origins of the innominate, left Common carotid artery and subclavian artery are widely patent. RIGHT CAROTID SYSTEM: Common carotid artery is  widely patent. Normal appearance of the carotid bifurcation without hemodynamically significant stenosis by NASCET criteria, mild calcific atherosclerosis. Normal appearance of the internal carotid artery. LEFT CAROTID SYSTEM: Common carotid artery is widely patent. Mild calcific atherosclerosis LEFT carotid bifurcation. Within 1 cm though origin is a 1 cm segment of 60% stenosis due to calcific atherosclerosis by NASCET criteria. Patent internal carotid artery. VERTEBRAL ARTERIES:Moderate stenosis RIGHT vertebral artery origin. Codominant vertebral artery's. SKELETON: New moderate to severe T6 compression fracture with 50-75% height loss. Mild T4 compression fracture, moderate to severe T5 compression fracture, status post cement augmentation at these 2 levels. OTHER NECK: Soft tissues of the neck are non-acute though, not tailored for evaluation. Linear secretions RIGHT mainstem bronchus. Mildly heterogeneous lung attenuation seen with small airway disease. CTA HEAD ANTERIOR CIRCULATION: Patent cervical internal carotid arteries, petrous, cavernous and supra clinoid internal carotid arteries. Widely patent anterior communicating artery. Patent anterior and middle cerebral arteries. No large vessel occlusion, significant stenosis, contrast extravasation or aneurysm. POSTERIOR CIRCULATION: Patent vertebral arteries, vertebrobasilar junction and basilar artery, as well as main branch vessels. Patent posterior cerebral arteries. No large vessel occlusion, significant stenosis, contrast extravasation or aneurysm. VENOUS SINUSES: Major dural venous sinuses are patent though not tailored for evaluation on this angiographic examination. ANATOMIC VARIANTS: Diminutive bilateral P1 segments with robust bilateral posterior communicating artery's. DELAYED PHASE: No abnormal intracranial enhancement. MIP images reviewed. IMPRESSION: CT HEAD: 1. Negative CT HEAD with and without contrast for age. CTA NECK: 1. 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 2. New, possibly acute T6 moderate to severe compression fracture. Status post T4 and T5 cement augmentation. CTA HEAD: 1. No emergent large vessel occlusion or severe stenosis. Complete circle of Willis. Aortic Atherosclerosis (ICD10-I70.0). Critical Value/emergent results were called by telephone at the time of interpretation on 02/26/2017 at 11:02 pm to Dr. Rory Percy, Neurology, who verbally acknowledged these results. Electronically Signed   By: Elon Alas M.D.   On: 02/26/2017 23:02   Ct Angio Neck W And/or Wo Contrast  Result Date: 02/26/2017 CLINICAL DATA:  Somnolent. Assess for basilar artery thrombosis. Status post vertebral body cement augmentation January 14, 2017. EXAM: CT ANGIOGRAPHY HEAD AND NECK TECHNIQUE: Multidetector CT imaging of the head and neck was performed using the standard  protocol during bolus administration of intravenous contrast. Multiplanar CT image reconstructions and MIPs were obtained to evaluate the vascular anatomy. Carotid stenosis measurements (when applicable) are obtained utilizing NASCET criteria, using the distal internal carotid diameter as the denominator. CONTRAST:  50 cc Isovue 370 COMPARISON:  MRI thoracic spine December 19, 2016 FINDINGS: CT HEAD FINDINGS- mildly motion degraded examination. BRAIN: No intraparenchymal hemorrhage, mass effect nor midline shift. The ventricles and sulci are normal for age. Patchy supratentorial white matter hypodensities less than expected for patient's age, though non-specific are most compatible with chronic small vessel ischemic disease. No acute large vascular territory infarcts. No abnormal extra-axial fluid collections. Basal cisterns are patent. VASCULAR: Mild calcific atherosclerosis of the carotid siphons. SKULL: No skull fracture. No significant scalp soft tissue swelling. SINUSES/ORBITS: Trace paranasal sinus mucosal thickening. Mastoid air cells are well aerated.The included ocular globes  and orbital contents are non-suspicious. Status post bilateral ocular lens implants. OTHER: None. CTA NECK AORTIC ARCH: Normal appearance of the thoracic arch, 2 vessel arch is a normal variant. Mild calcific atherosclerosis The origins of the innominate, left Common carotid artery and subclavian artery are widely patent. RIGHT CAROTID SYSTEM: Common carotid artery is widely patent. Normal appearance of the carotid bifurcation without hemodynamically significant stenosis by NASCET criteria, mild calcific atherosclerosis. Normal appearance of the internal carotid artery. LEFT CAROTID SYSTEM: Common carotid artery is widely patent. Mild calcific atherosclerosis LEFT carotid bifurcation. Within 1 cm though origin is a 1 cm segment of 60% stenosis due to calcific atherosclerosis by NASCET criteria. Patent internal carotid artery. VERTEBRAL ARTERIES:Moderate stenosis RIGHT vertebral artery origin. Codominant vertebral artery's. SKELETON: New moderate to severe T6 compression fracture with 50-75% height loss. Mild T4 compression fracture, moderate to severe T5 compression fracture, status post cement augmentation at these 2 levels. OTHER NECK: Soft tissues of the neck are non-acute though, not tailored for evaluation. Linear secretions RIGHT mainstem bronchus. Mildly heterogeneous lung attenuation seen with small airway disease. CTA HEAD ANTERIOR CIRCULATION: Patent cervical internal carotid arteries, petrous, cavernous and supra clinoid internal carotid arteries. Widely patent anterior communicating artery. Patent anterior and middle cerebral arteries. No large vessel occlusion, significant stenosis, contrast extravasation or aneurysm. POSTERIOR CIRCULATION: Patent vertebral arteries, vertebrobasilar junction and basilar artery, as well as main branch vessels. Patent posterior cerebral arteries. No large vessel occlusion, significant stenosis, contrast extravasation or aneurysm. VENOUS SINUSES: Major dural venous sinuses  are patent though not tailored for evaluation on this angiographic examination. ANATOMIC VARIANTS: Diminutive bilateral P1 segments with robust bilateral posterior communicating artery's. DELAYED PHASE: No abnormal intracranial enhancement. MIP images reviewed. IMPRESSION: CT HEAD: 1. Negative CT HEAD with and without contrast for age. CTA NECK: 1. 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 2. New, possibly acute T6 moderate to severe compression fracture. Status post T4 and T5 cement augmentation. CTA HEAD: 1. No emergent large vessel occlusion or severe stenosis. Complete circle of Willis. Aortic Atherosclerosis (ICD10-I70.0). Critical Value/emergent results were called by telephone at the time of interpretation on 02/26/2017 at 11:02 pm to Dr. Rory Percy, Neurology, who verbally acknowledged these results. Electronically Signed   By: Elon Alas M.D.   On: 02/26/2017 23:02   Dg Chest Port 1 View  Result Date: 02/26/2017 CLINICAL DATA:  Altered mental status, somnolent. EXAM: PORTABLE CHEST 1 VIEW COMPARISON:  MRI 02/15/2017 of the thoracic spine, CXR 01/21/2008. FINDINGS: Lung volumes are low with mild vascular congestion centrally. The thoracic aorta is slightly uncoiled in appearance and atherosclerotic. Vertebral augmentation is noted with cement  in the mid to upper thoracic spine. IMPRESSION: 1. Low lung volumes with mild central vascular congestion. No acute pulmonary disease. 2. Aortic atherosclerosis. 3. New kyphoplasty cement projects over the mid to upper thoracic spine. Electronically Signed   By: Ashley Royalty M.D.   On: 02/26/2017 22:19    Procedures Procedures (including critical care time)  Medications Ordered in ED Medications  naloxone [facility]) injection 0.4 mg (0.4 mg Intravenous Given 02/26/17 2234)  ipratropium-albuterol (DUONEB) 0.5-2.5 (3) MG/3ML nebulizer solution 3 mL (3 mLs Nebulization Given 02/26/17 2244)  iopamidol (ISOVUE-370) 76 % injection (50 mLs  Contrast Given  02/26/17 2223)     Initial Impression / Assessment and Plan / ED Course  I have reviewed the triage vital signs and the nursing notes.  Pertinent labs & imaging results that were available during my care of the patient were reviewed by me and considered in my medical decision making (see chart for details).   patient here with altered mental status. She is intermittently conversant with dysarthric speech followed by unresponsiveness. Her symptoms are rapidly fluctuating. Concern for metabolic illness versus basilar insufficiency versus encephalopathy. Discussed with neurologist, plan to obtain CTA to rule out basilar stroke.  Following CTA she received Narcan with improvement of her mental status but she did report feeling jerking in her arms and legs. Given the patient is on methadone and she potentially overdose on this medication hospitalist consulted for admission for observation and further evaluation. Patient denies any intentional overdose.  Discussed with Dr. Alcario Drought with the Hospitalist service regarding admission.   Final Clinical Impressions(s) / ED Diagnoses   Final diagnoses:  None    New Prescriptions New Prescriptions   No medications on file     Quintella Reichert, MD 02/27/17 Leandrew Koyanagi    Quintella Reichert, MD 02/27/17 [phone]

## 2017-02-26 NOTE — Telephone Encounter (Signed)
Called pt's PCP, left VM stating that Dr. Estanislado Pandy is treating a compression fracture at the T6 level on Monday, 03/01/17 at 10 am. Instructed them to fax an order for a biopsy of the level if they wanted one and if we did not have a faxed order no biopsy would be performed. JM

## 2017-02-27 ENCOUNTER — Observation Stay (HOSPITAL_COMMUNITY): Payer: Medicare Other

## 2017-02-27 DIAGNOSIS — M4850XS Collapsed vertebra, not elsewhere classified, site unspecified, sequela of fracture: Secondary | ICD-10-CM

## 2017-02-27 DIAGNOSIS — R4182 Altered mental status, unspecified: Secondary | ICD-10-CM | POA: Diagnosis present

## 2017-02-27 DIAGNOSIS — M797 Fibromyalgia: Secondary | ICD-10-CM

## 2017-02-27 DIAGNOSIS — G9341 Metabolic encephalopathy: Principal | ICD-10-CM

## 2017-02-27 DIAGNOSIS — R400 Somnolence: Secondary | ICD-10-CM | POA: Diagnosis not present

## 2017-02-27 DIAGNOSIS — G9050 Complex regional pain syndrome I, unspecified: Secondary | ICD-10-CM

## 2017-02-27 LAB — AMMONIA: Ammonia: 16 umol/L (ref 9–35)

## 2017-02-27 MED ORDER — ONDANSETRON HCL 4 MG PO TABS: 4.0000 mg | ORAL_TABLET | Freq: Four times a day (QID) | ORAL | Status: DC | PRN

## 2017-02-27 MED ORDER — BACLOFEN 10 MG PO TABS: 10.0000 mg | ORAL_TABLET | Freq: Three times a day (TID) | ORAL | Status: DC | PRN

## 2017-02-27 MED ORDER — DICYCLOMINE HCL 20 MG PO TABS
20.0000 mg | ORAL_TABLET | Freq: Three times a day (TID) | ORAL | Status: DC
  Administered 2017-02-27 (×2): 20 mg via ORAL
  Filled 2017-02-27 (×2): qty 1

## 2017-02-27 MED ORDER — CALCITONIN (SALMON) 200 UNIT/ACT NA SOLN
1.0000 | Freq: Every day | NASAL | Status: DC
  Filled 2017-02-27: qty 3.7

## 2017-02-27 MED ORDER — FLUTICASONE PROPIONATE 50 MCG/ACT NA SUSP
2.0000 | Freq: Every day | NASAL | Status: DC
  Administered 2017-02-27: 2 via NASAL
  Filled 2017-02-27: qty 16

## 2017-02-27 MED ORDER — ACETAMINOPHEN 325 MG PO TABS
650.0000 mg | ORAL_TABLET | Freq: Four times a day (QID) | ORAL | Status: DC | PRN
  Administered 2017-02-27: 650 mg via ORAL
  Filled 2017-02-27: qty 2

## 2017-02-27 MED ORDER — ACETAMINOPHEN 325 MG PO TABS
650.0000 mg | ORAL_TABLET | Freq: Once | ORAL | Status: AC
  Administered 2017-02-27: 650 mg via ORAL
  Filled 2017-02-27: qty 2

## 2017-02-27 MED ORDER — FAMOTIDINE 20 MG PO TABS
20.0000 mg | ORAL_TABLET | Freq: Two times a day (BID) | ORAL | Status: DC
  Administered 2017-02-27: 20 mg via ORAL
  Filled 2017-02-27: qty 1

## 2017-02-27 MED ORDER — MELOXICAM 7.5 MG PO TABS
15.0000 mg | ORAL_TABLET | Freq: Every day | ORAL | Status: DC
  Administered 2017-02-27: 15 mg via ORAL
  Filled 2017-02-27 (×2): qty 2

## 2017-02-27 MED ORDER — ONDANSETRON HCL 4 MG/2ML IJ SOLN: 4.0000 mg | Freq: Four times a day (QID) | INTRAMUSCULAR | Status: DC | PRN

## 2017-02-27 MED ORDER — METFORMIN HCL ER 500 MG PO TB24
500.0000 mg | ORAL_TABLET | Freq: Every day | ORAL | Status: DC
  Administered 2017-02-27: 500 mg via ORAL
  Filled 2017-02-27: qty 1

## 2017-02-27 MED ORDER — COLESEVELAM HCL 625 MG PO TABS
1875.0000 mg | ORAL_TABLET | Freq: Two times a day (BID) | ORAL | Status: DC
  Administered 2017-02-27: 1875 mg via ORAL
  Filled 2017-02-27 (×2): qty 3

## 2017-02-27 MED ORDER — CYANOCOBALAMIN 1000 MCG/ML IJ KIT: 1.0000 mL | PACK | INTRAMUSCULAR | Status: DC

## 2017-02-27 MED ORDER — METFORMIN HCL ER 500 MG PO TB24
1000.0000 mg | ORAL_TABLET | Freq: Every day | ORAL | Status: DC
  Filled 2017-02-27: qty 2

## 2017-02-27 MED ORDER — ACETAMINOPHEN 650 MG RE SUPP: 650.0000 mg | Freq: Four times a day (QID) | RECTAL | Status: DC | PRN

## 2017-02-27 MED ORDER — DICLOFENAC SODIUM 1 % TD GEL
2.0000 g | Freq: Four times a day (QID) | TRANSDERMAL | Status: DC
  Administered 2017-02-27: 13:00:00 2 g via TOPICAL
  Filled 2017-02-27 (×2): qty 100

## 2017-02-27 MED ORDER — TRAZODONE HCL 100 MG PO TABS: 100.0000 mg | ORAL_TABLET | Freq: Every day | ORAL | Status: DC

## 2017-02-27 MED ORDER — ENOXAPARIN SODIUM 30 MG/0.3ML ~~LOC~~ SOLN: 30.0000 mg | SUBCUTANEOUS | Status: DC

## 2017-02-27 MED ORDER — METHADONE HCL 5 MG PO TABS
5.0000 mg | ORAL_TABLET | Freq: Three times a day (TID) | ORAL | Status: DC
  Administered 2017-02-27: 5 mg via ORAL
  Filled 2017-02-27: qty 1

## 2017-02-27 MED ORDER — FAMOTIDINE 20 MG PO TABS: 20.0000 mg | ORAL_TABLET | Freq: Every day | ORAL | Status: DC

## 2017-02-27 MED ORDER — PROMETHAZINE HCL 25 MG PO TABS: 25.0000 mg | ORAL_TABLET | Freq: Three times a day (TID) | ORAL | Status: DC | PRN

## 2017-02-27 MED ORDER — DICLOFENAC SODIUM 1 % TD GEL: 2.0000 g | Freq: Four times a day (QID) | TRANSDERMAL | 0 refills | Status: DC

## 2017-02-27 MED ORDER — MAGNESIUM OXIDE 400 (241.3 MG) MG PO TABS
400.0000 mg | ORAL_TABLET | Freq: Two times a day (BID) | ORAL | Status: DC
  Administered 2017-02-27: 400 mg via ORAL
  Filled 2017-02-27: qty 1

## 2017-02-27 NOTE — ED Notes (Signed)
Pt pulled out R AC IV.  Will replace.

## 2017-02-27 NOTE — ED Notes (Signed)
Pt in MRI.

## 2017-02-27 NOTE — Progress Notes (Signed)
Danielle Fuller to be D/C'd to home per MD order.  Discussed with the patient and all questions fully answered.  VSS, Skin clean, dry and intact without evidence of skin break down, no evidence of skin tears noted. IV catheter discontinued intact. Site without signs and symptoms of complications. Dressing and pressure applied.  An After Visit Summary was printed and given to the patient. Patient received prescription.  D/c education completed with patient/family including follow up instructions, medication list, d/c activities limitations if indicated, with other d/c instructions as indicated by MD - patient able to verbalize understanding, all questions fully answered.   Patient instructed to return to ED, call 911, or call MD for any changes in condition.   Patient escorted via [place], and D/C home via private auto.  Morley Kos Price 02/27/2017 4:26 PM

## 2017-02-27 NOTE — H&P (Signed)
History and Physical    Danielle Fuller GNF:[id number] DOB: 1945/08/25 DOA: 02/26/2017  PCP: Ernestene Kiel, MD  Patient coming from: Home  I have personally briefly reviewed patient's old medical records in [place]  Chief Complaint: AMS  HPI: Danielle Fuller is a 71 y.o. female with medical history significant of RSD, DM2, fibromyalgia on chronic methadone.  Patient presents to the ED with AMS.  Head drooping often and more confusion with changes in speech per husband.  Patient was recently started on Oxy IR in addition to her methadone because she has a T6 compression fx (that is scheduled for surgery on Monday).   ED Course: Patient CTA is negative.  Given 0.43m narcan with improvement in mental status but did report jerking movements in arms and legs.   Review of Systems: As per HPI otherwise 10 point review of systems negative.   Past Medical History:  Diagnosis Date  . Diabetes mellitus without complication ([place]   . Fibromyalgia   . Neuropathy   . RSD (reflex sympathetic dystrophy)     Past Surgical History:  Procedure Laterality Date  . ANKLE FRACTURE SURGERY    . CARPAL TUNNEL RELEASE    . IR RADIOLOGIST EVAL & MGMT  01/07/2017  . IR RADIOLOGIST EVAL & MGMT  02/02/2017  . IR VERTEBROPLASTY CERV/THOR BX INC UNI/BIL INC/INJECT/IMAGING  01/14/2017  . IR VERTEBROPLASTY EA ADDL (T&LS) BX INC UNI/BIL INC INJECT/IMAGING  01/14/2017  . KNEE ARTHROSCOPY       reports that she has quit smoking. Her smoking use included Cigarettes. She has never used smokeless tobacco. She reports that she does not drink alcohol or use drugs.  No Known Allergies  Family History  Problem Relation Age of Onset  . Stroke Mother   . Heart disease Father      Prior to Admission medications   Medication Sig Start Date End Date Taking? Authorizing Provider  baclofen (LIORESAL) 20 MG tablet Take 20 mg by mouth 3 (three) times daily as needed for muscle spasms.   Yes [provider]  calcitonin, salmon, (MIACALCIN/FORTICAL) 200 UNIT/ACT nasal spray Place 1 spray into alternate nostrils daily.   Yes [provider]  colesevelam (WELCHOL) 625 MG tablet Take 1,875 mg by mouth 2 (two) times daily with a meal.   Yes [provider]  Cyanocobalamin (B-12 COMPLIANCE INJECTION) 1000 MCG/ML KIT Inject 1 mL as directed every 30 (thirty) days.   Yes [provider]  dicyclomine (BENTYL) 20 MG tablet Take 20 mg by mouth 3 (three) times daily before meals.   Yes [provider]  fluticasone (FLONASE) 50 MCG/ACT nasal spray Place 2 sprays into both nostrils daily.   Yes [provider]  magnesium oxide (MAG-OX) 400 MG tablet Take 400 mg by mouth 2 (two) times daily.   Yes [provider]  meloxicam (MOBIC) 15 MG tablet Take 15 mg by mouth daily. 01/31/17  Yes [provider]  metFORMIN (GLUCOPHAGE-XR) 500 MG 24 hr tablet Take 500-1,000 mg by mouth See admin instructions. Take 500 mg every morning  Take 1000 mg every evening    Yes [provider]  methadone (DOLOPHINE) 5 MG tablet Take 5 mg by mouth 5 (five) times daily.   Yes [provider]  promethazine (PHENERGAN) 25 MG tablet Take 25 mg by mouth 3 (three) times daily as needed for nausea or vomiting.   Yes [provider]  ranitidine (ZANTAC) 150 MG tablet Take 150 mg  by mouth 2 (two) times daily.   Yes [provider]  traZODone (DESYREL) 100 MG tablet Take 100 mg by mouth at bedtime.   Yes [provider]    Physical Exam: Vitals:   02/27/17 0200 02/27/17 0215 02/27/17 0300 02/27/17 0330  BP: (!) 154/47 (!) 124/49 (!) 124/49 117/85  Pulse: 73 70 65 77  Resp: 14 20 (!) 7 (!) 24  Temp:      TempSrc:      SpO2: 94% 93% 95% 90%  Weight:      Height:        Constitutional: NAD, calm, comfortable Eyes: PERRL, lids and conjunctivae normal ENMT: Mucous membranes are moist. Posterior pharynx clear of any  exudate or lesions.Normal dentition.  Neck: normal, supple, no masses, no thyromegaly Respiratory: clear to auscultation bilaterally, no wheezing, no crackles. Normal respiratory effort. No accessory muscle use.  Cardiovascular: Regular rate and rhythm, no murmurs / rubs / gallops. No extremity edema. 2+ pedal pulses. No carotid bruits.  Abdomen: no tenderness, no masses palpated. No hepatosplenomegaly. Bowel sounds positive.  Musculoskeletal: no clubbing / cyanosis. No joint deformity upper and lower extremities. Good ROM, no contractures. Normal muscle tone.  Skin: no rashes, lesions, ulcers. No induration Neurologic: CN 2-12 grossly intact. Sensation intact, DTR normal. Strength 5/5 in all 4.  Psychiatric: Normal judgment and insight. Alert and oriented x 3. Normal mood.    Labs on Admission: I have personally reviewed following labs and imaging studies  CBC:  Recent Labs Lab 02/26/17 2149 02/26/17 2203  WBC 7.7  --   HGB 11.4* 11.2*  HCT 34.6* 33.0*  MCV 90.3  --   PLT 291  --    Basic Metabolic Panel:  Recent Labs Lab 02/26/17 2149 02/26/17 2203  NA 141 141  K 3.8 3.8  CL 108 109  CO2 24  --   GLUCOSE 148* 138*  BUN 23* 24*  CREATININE 1.29* 1.40*  CALCIUM 9.4  --    GFR: Estimated Creatinine Clearance: 26.1 mL/min (A) (by C-G formula based on SCr of 1.4 mg/dL (H)). Liver Function Tests:  Recent Labs Lab 02/26/17 2149  AST 15  ALT 13*  ALKPHOS 89  BILITOT 0.4  PROT 7.1  ALBUMIN 3.5   No results for input(s): LIPASE, AMYLASE in the last 168 hours.  Recent Labs Lab 02/27/17 0108  AMMONIA 16   Coagulation Profile: No results for input(s): INR, PROTIME in the last 168 hours. Cardiac Enzymes: No results for input(s): CKTOTAL, CKMB, CKMBINDEX, TROPONINI in the last 168 hours. BNP (last 3 results) No results for input(s): PROBNP in the last 8760 hours. HbA1C: No results for input(s): HGBA1C in the last 72 hours. CBG:  Recent Labs Lab  02/26/17 2150  GLUCAP 152*   Lipid Profile: No results for input(s): CHOL, HDL, LDLCALC, TRIG, CHOLHDL, LDLDIRECT in the last 72 hours. Thyroid Function Tests: No results for input(s): TSH, T4TOTAL, FREET4, T3FREE, THYROIDAB in the last 72 hours. Anemia Panel: No results for input(s): VITAMINB12, FOLATE, FERRITIN, TIBC, IRON, RETICCTPCT in the last 72 hours. Urine analysis: No results found for: COLORURINE, APPEARANCEUR, [place], [place], GLUCOSEU, HGBUR, BILIRUBINUR, KETONESUR, PROTEINUR, UROBILINOGEN, NITRITE, LEUKOCYTESUR  Radiological Exams on Admission: Ct Angio Head W Or Wo Contrast  Result Date: 02/26/2017 CLINICAL DATA:  Somnolent. Assess for basilar artery thrombosis. Status post vertebral body cement augmentation January 14, 2017. EXAM: CT ANGIOGRAPHY HEAD AND NECK TECHNIQUE: Multidetector CT imaging of the head and neck was performed using the standard protocol  during bolus administration of intravenous contrast. Multiplanar CT image reconstructions and MIPs were obtained to evaluate the vascular anatomy. Carotid stenosis measurements (when applicable) are obtained utilizing NASCET criteria, using the distal internal carotid diameter as the denominator. CONTRAST:  50 cc Isovue 370 COMPARISON:  MRI thoracic spine December 19, 2016 FINDINGS: CT HEAD FINDINGS- mildly motion degraded examination. BRAIN: No intraparenchymal hemorrhage, mass effect nor midline shift. The ventricles and sulci are normal for age. Patchy supratentorial white matter hypodensities less than expected for patient's age, though non-specific are most compatible with chronic small vessel ischemic disease. No acute large vascular territory infarcts. No abnormal extra-axial fluid collections. Basal cisterns are patent. VASCULAR: Mild calcific atherosclerosis of the carotid siphons. SKULL: No skull fracture. No significant scalp soft tissue swelling. SINUSES/ORBITS: Trace paranasal sinus mucosal thickening. Mastoid air cells are  well aerated.The included ocular globes and orbital contents are non-suspicious. Status post bilateral ocular lens implants. OTHER: None. CTA NECK AORTIC ARCH: Normal appearance of the thoracic arch, 2 vessel arch is a normal variant. Mild calcific atherosclerosis The origins of the innominate, left Common carotid artery and subclavian artery are widely patent. RIGHT CAROTID SYSTEM: Common carotid artery is widely patent. Normal appearance of the carotid bifurcation without hemodynamically significant stenosis by NASCET criteria, mild calcific atherosclerosis. Normal appearance of the internal carotid artery. LEFT CAROTID SYSTEM: Common carotid artery is widely patent. Mild calcific atherosclerosis LEFT carotid bifurcation. Within 1 cm though origin is a 1 cm segment of 60% stenosis due to calcific atherosclerosis by NASCET criteria. Patent internal carotid artery. VERTEBRAL ARTERIES:Moderate stenosis RIGHT vertebral artery origin. Codominant vertebral artery's. SKELETON: New moderate to severe T6 compression fracture with 50-75% height loss. Mild T4 compression fracture, moderate to severe T5 compression fracture, status post cement augmentation at these 2 levels. OTHER NECK: Soft tissues of the neck are non-acute though, not tailored for evaluation. Linear secretions RIGHT mainstem bronchus. Mildly heterogeneous lung attenuation seen with small airway disease. CTA HEAD ANTERIOR CIRCULATION: Patent cervical internal carotid arteries, petrous, cavernous and supra clinoid internal carotid arteries. Widely patent anterior communicating artery. Patent anterior and middle cerebral arteries. No large vessel occlusion, significant stenosis, contrast extravasation or aneurysm. POSTERIOR CIRCULATION: Patent vertebral arteries, vertebrobasilar junction and basilar artery, as well as main branch vessels. Patent posterior cerebral arteries. No large vessel occlusion, significant stenosis, contrast extravasation or aneurysm.  VENOUS SINUSES: Major dural venous sinuses are patent though not tailored for evaluation on this angiographic examination. ANATOMIC VARIANTS: Diminutive bilateral P1 segments with robust bilateral posterior communicating artery's. DELAYED PHASE: No abnormal intracranial enhancement. MIP images reviewed. IMPRESSION: CT HEAD: 1. Negative CT HEAD with and without contrast for age. CTA NECK: 1. 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 2. New, possibly acute T6 moderate to severe compression fracture. Status post T4 and T5 cement augmentation. CTA HEAD: 1. No emergent large vessel occlusion or severe stenosis. Complete circle of Willis. Aortic Atherosclerosis (ICD10-I70.0). Critical Value/emergent results were called by telephone at the time of interpretation on 02/26/2017 at 11:02 pm to Dr. Rory Percy, Neurology, who verbally acknowledged these results. Electronically Signed   By: Elon Alas M.D.   On: 02/26/2017 23:02   Ct Angio Neck W And/or Wo Contrast  Result Date: 02/26/2017 CLINICAL DATA:  Somnolent. Assess for basilar artery thrombosis. Status post vertebral body cement augmentation January 14, 2017. EXAM: CT ANGIOGRAPHY HEAD AND NECK TECHNIQUE: Multidetector CT imaging of the head and neck was performed using the standard protocol during bolus administration of intravenous contrast. Multiplanar CT  image reconstructions and MIPs were obtained to evaluate the vascular anatomy. Carotid stenosis measurements (when applicable) are obtained utilizing NASCET criteria, using the distal internal carotid diameter as the denominator. CONTRAST:  50 cc Isovue 370 COMPARISON:  MRI thoracic spine December 19, 2016 FINDINGS: CT HEAD FINDINGS- mildly motion degraded examination. BRAIN: No intraparenchymal hemorrhage, mass effect nor midline shift. The ventricles and sulci are normal for age. Patchy supratentorial white matter hypodensities less than expected for patient's age, though non-specific are most compatible with  chronic small vessel ischemic disease. No acute large vascular territory infarcts. No abnormal extra-axial fluid collections. Basal cisterns are patent. VASCULAR: Mild calcific atherosclerosis of the carotid siphons. SKULL: No skull fracture. No significant scalp soft tissue swelling. SINUSES/ORBITS: Trace paranasal sinus mucosal thickening. Mastoid air cells are well aerated.The included ocular globes and orbital contents are non-suspicious. Status post bilateral ocular lens implants. OTHER: None. CTA NECK AORTIC ARCH: Normal appearance of the thoracic arch, 2 vessel arch is a normal variant. Mild calcific atherosclerosis The origins of the innominate, left Common carotid artery and subclavian artery are widely patent. RIGHT CAROTID SYSTEM: Common carotid artery is widely patent. Normal appearance of the carotid bifurcation without hemodynamically significant stenosis by NASCET criteria, mild calcific atherosclerosis. Normal appearance of the internal carotid artery. LEFT CAROTID SYSTEM: Common carotid artery is widely patent. Mild calcific atherosclerosis LEFT carotid bifurcation. Within 1 cm though origin is a 1 cm segment of 60% stenosis due to calcific atherosclerosis by NASCET criteria. Patent internal carotid artery. VERTEBRAL ARTERIES:Moderate stenosis RIGHT vertebral artery origin. Codominant vertebral artery's. SKELETON: New moderate to severe T6 compression fracture with 50-75% height loss. Mild T4 compression fracture, moderate to severe T5 compression fracture, status post cement augmentation at these 2 levels. OTHER NECK: Soft tissues of the neck are non-acute though, not tailored for evaluation. Linear secretions RIGHT mainstem bronchus. Mildly heterogeneous lung attenuation seen with small airway disease. CTA HEAD ANTERIOR CIRCULATION: Patent cervical internal carotid arteries, petrous, cavernous and supra clinoid internal carotid arteries. Widely patent anterior communicating artery. Patent anterior  and middle cerebral arteries. No large vessel occlusion, significant stenosis, contrast extravasation or aneurysm. POSTERIOR CIRCULATION: Patent vertebral arteries, vertebrobasilar junction and basilar artery, as well as main branch vessels. Patent posterior cerebral arteries. No large vessel occlusion, significant stenosis, contrast extravasation or aneurysm. VENOUS SINUSES: Major dural venous sinuses are patent though not tailored for evaluation on this angiographic examination. ANATOMIC VARIANTS: Diminutive bilateral P1 segments with robust bilateral posterior communicating artery's. DELAYED PHASE: No abnormal intracranial enhancement. MIP images reviewed. IMPRESSION: CT HEAD: 1. Negative CT HEAD with and without contrast for age. CTA NECK: 1. 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 2. New, possibly acute T6 moderate to severe compression fracture. Status post T4 and T5 cement augmentation. CTA HEAD: 1. No emergent large vessel occlusion or severe stenosis. Complete circle of Willis. Aortic Atherosclerosis (ICD10-I70.0). Critical Value/emergent results were called by telephone at the time of interpretation on 02/26/2017 at 11:02 pm to Dr. Rory Percy, Neurology, who verbally acknowledged these results. Electronically Signed   By: Elon Alas M.D.   On: 02/26/2017 23:02   Dg Chest Port 1 View  Result Date: 02/26/2017 CLINICAL DATA:  Altered mental status, somnolent. EXAM: PORTABLE CHEST 1 VIEW COMPARISON:  MRI 02/15/2017 of the thoracic spine, CXR 01/21/2008. FINDINGS: Lung volumes are low with mild vascular congestion centrally. The thoracic aorta is slightly uncoiled in appearance and atherosclerotic. Vertebral augmentation is noted with cement in the mid to upper thoracic spine. IMPRESSION: 1.  Low lung volumes with mild central vascular congestion. No acute pulmonary disease. 2. Aortic atherosclerosis. 3. New kyphoplasty cement projects over the mid to upper thoracic spine. Electronically Signed    By: Ashley Royalty M.D.   On: 02/26/2017 22:19    EKG: Independently reviewed.  Assessment/Plan Active Problems:   Altered mental status    1. AMS - 1. Improved with Narcan, now AAOx4 2. Does have "jerking" movements of arms and legs 3. UDS pending 4. MRI brain pending per neuro recs 5. Will admit for overnight obs 6. Resume only the chronic methadone 62m TID 7. DC oxycodone 8. DC baclofen 9. Unless MRI is positive, likely can be discharged home later this morning  DVT prophylaxis: Lovenox Code Status: Full Family Communication: Husband at bedside Disposition Plan: Home after admit Consults called: Neuro called by EDP Admission status: Place in obs   GARDNER, [facility] Pager [phone] If 7AM-7PM, please contact day team taking care of patient [email or web address] Password [email or web address]  02/27/2017, 4:04 AM

## 2017-02-27 NOTE — Discharge Summary (Signed)
Physician Discharge Summary  Danielle Fuller HYI:[id number] DOB: 02/17/1946 DOA: 02/26/2017  PCP: Ernestene Kiel, MD  Admit date: 02/26/2017 Discharge date: 02/27/2017  Admitted From: home  Disposition:  home   Recommendations for Outpatient Follow-up:  1. Avoid Oxycontin  Discharge Condition:  stable   CODE STATUS:  Full code   Consultations:  none    Discharge Diagnoses:  Principal Problem:   Acute metabolic encephalopathy Active Problems:   RSD (reflex sympathetic dystrophy)   Fibromyalgia    Subjective: Was in pain when I saw her this AM. She had not received her AM Methadone. Once she received it, she feels ready to go home.   Brief Summary: Danielle Fuller is a 71 y.o. female with medical history significant of RSD, DM2, fibromyalgia on chronic methadone.  Patient presents to the ED with AMS.  Head drooping often and more confusion with changes in speech per husband.  Patient was recently started on Oxy IR in addition to her methadone because she has a T6 compression fx (that is scheduled for surgery on Monday).  Hospital Course:  Lethargy and confusion - history of RSD, Fibromyalgia - on Methadone and Baclofen for "years" - apparently happened after 1 tab of  Oxy was taken by the patient prescribed for back pain - she is back to baseline today and feels that she will be fine with just Methadone - she apparently has a procedure tomorrow for a vertebral compression fracture. She had IR augmentation of T4 & T5 and MRI from 7/30 shows a new fracture in 7/6.   - I have examined her and note that she has tenderness in bilateral scapular areas- her husband states that the pain is in her lumbar area as well sometimes- have added Voltaren gel for musculoskeletal component.    Discharge Instructions  Discharge Instructions    Diet - low sodium heart healthy    Complete by:  As directed    Increase activity slowly    Complete by:  As directed      Allergies as of  02/27/2017   No Known Allergies     Medication List    TAKE these medications   B-12 COMPLIANCE INJECTION 1000 MCG/ML Kit Generic drug:  Cyanocobalamin Inject 1 mL as directed every 30 (thirty) days.   baclofen 20 MG tablet Commonly known as:  LIORESAL Take 20 mg by mouth 3 (three) times daily as needed for muscle spasms.   calcitonin (salmon) 200 UNIT/ACT nasal spray Commonly known as:  MIACALCIN/FORTICAL Place 1 spray into alternate nostrils daily.   colesevelam 625 MG tablet Commonly known as:  WELCHOL Take 1,875 mg by mouth 2 (two) times daily with a meal.   diclofenac sodium 1 % Gel Commonly known as:  VOLTAREN Apply 2 g topically 4 (four) times daily.   dicyclomine 20 MG tablet Commonly known as:  BENTYL Take 20 mg by mouth 3 (three) times daily before meals.   fluticasone 50 MCG/ACT nasal spray Commonly known as:  FLONASE Place 2 sprays into both nostrils daily.   magnesium oxide 400 MG tablet Commonly known as:  MAG-OX Take 400 mg by mouth 2 (two) times daily.   metFORMIN 500 MG 24 hr tablet Commonly known as:  GLUCOPHAGE-XR Take 500-1,000 mg by mouth See admin instructions. Take 500 mg every morning  Take 1000 mg every evening   methadone 5 MG tablet Commonly known as:  DOLOPHINE Take 5 mg by mouth 5 (five) times daily.   promethazine 25 MG  tablet Commonly known as:  PHENERGAN Take 25 mg by mouth 3 (three) times daily as needed for nausea or vomiting.   ranitidine 150 MG tablet Commonly known as:  ZANTAC Take 150 mg by mouth 2 (two) times daily.   traZODone 100 MG tablet Commonly known as:  DESYREL Take 100 mg by mouth at bedtime.       No Known Allergies   Procedures/Studies:    Ct Angio Head W Or Wo Contrast  Result Date: 02/26/2017 CLINICAL DATA:  Somnolent. Assess for basilar artery thrombosis. Status post vertebral body cement augmentation January 14, 2017. EXAM: CT ANGIOGRAPHY HEAD AND NECK TECHNIQUE: Multidetector CT imaging of the  head and neck was performed using the standard protocol during bolus administration of intravenous contrast. Multiplanar CT image reconstructions and MIPs were obtained to evaluate the vascular anatomy. Carotid stenosis measurements (when applicable) are obtained utilizing NASCET criteria, using the distal internal carotid diameter as the denominator. CONTRAST:  50 cc Isovue 370 COMPARISON:  MRI thoracic spine December 19, 2016 FINDINGS: CT HEAD FINDINGS- mildly motion degraded examination. BRAIN: No intraparenchymal hemorrhage, mass effect nor midline shift. The ventricles and sulci are normal for age. Patchy supratentorial white matter hypodensities less than expected for patient's age, though non-specific are most compatible with chronic small vessel ischemic disease. No acute large vascular territory infarcts. No abnormal extra-axial fluid collections. Basal cisterns are patent. VASCULAR: Mild calcific atherosclerosis of the carotid siphons. SKULL: No skull fracture. No significant scalp soft tissue swelling. SINUSES/ORBITS: Trace paranasal sinus mucosal thickening. Mastoid air cells are well aerated.The included ocular globes and orbital contents are non-suspicious. Status post bilateral ocular lens implants. OTHER: None. CTA NECK AORTIC ARCH: Normal appearance of the thoracic arch, 2 vessel arch is a normal variant. Mild calcific atherosclerosis The origins of the innominate, left Common carotid artery and subclavian artery are widely patent. RIGHT CAROTID SYSTEM: Common carotid artery is widely patent. Normal appearance of the carotid bifurcation without hemodynamically significant stenosis by NASCET criteria, mild calcific atherosclerosis. Normal appearance of the internal carotid artery. LEFT CAROTID SYSTEM: Common carotid artery is widely patent. Mild calcific atherosclerosis LEFT carotid bifurcation. Within 1 cm though origin is a 1 cm segment of 60% stenosis due to calcific atherosclerosis by NASCET criteria.  Patent internal carotid artery. VERTEBRAL ARTERIES:Moderate stenosis RIGHT vertebral artery origin. Codominant vertebral artery's. SKELETON: New moderate to severe T6 compression fracture with 50-75% height loss. Mild T4 compression fracture, moderate to severe T5 compression fracture, status post cement augmentation at these 2 levels. OTHER NECK: Soft tissues of the neck are non-acute though, not tailored for evaluation. Linear secretions RIGHT mainstem bronchus. Mildly heterogeneous lung attenuation seen with small airway disease. CTA HEAD ANTERIOR CIRCULATION: Patent cervical internal carotid arteries, petrous, cavernous and supra clinoid internal carotid arteries. Widely patent anterior communicating artery. Patent anterior and middle cerebral arteries. No large vessel occlusion, significant stenosis, contrast extravasation or aneurysm. POSTERIOR CIRCULATION: Patent vertebral arteries, vertebrobasilar junction and basilar artery, as well as main branch vessels. Patent posterior cerebral arteries. No large vessel occlusion, significant stenosis, contrast extravasation or aneurysm. VENOUS SINUSES: Major dural venous sinuses are patent though not tailored for evaluation on this angiographic examination. ANATOMIC VARIANTS: Diminutive bilateral P1 segments with robust bilateral posterior communicating artery's. DELAYED PHASE: No abnormal intracranial enhancement. MIP images reviewed. IMPRESSION: CT HEAD: 1. Negative CT HEAD with and without contrast for age. CTA NECK: 1. 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 2. New, possibly acute T6 moderate to severe compression fracture. Status  post T4 and T5 cement augmentation. CTA HEAD: 1. No emergent large vessel occlusion or severe stenosis. Complete circle of Willis. Aortic Atherosclerosis (ICD10-I70.0). Critical Value/emergent results were called by telephone at the time of interpretation on 02/26/2017 at 11:02 pm to Dr. Rory Percy, Neurology, who verbally  acknowledged these results. Electronically Signed   By: Elon Alas M.D.   On: 02/26/2017 23:02   Ct Angio Neck W And/or Wo Contrast  Result Date: 02/26/2017 CLINICAL DATA:  Somnolent. Assess for basilar artery thrombosis. Status post vertebral body cement augmentation January 14, 2017. EXAM: CT ANGIOGRAPHY HEAD AND NECK TECHNIQUE: Multidetector CT imaging of the head and neck was performed using the standard protocol during bolus administration of intravenous contrast. Multiplanar CT image reconstructions and MIPs were obtained to evaluate the vascular anatomy. Carotid stenosis measurements (when applicable) are obtained utilizing NASCET criteria, using the distal internal carotid diameter as the denominator. CONTRAST:  50 cc Isovue 370 COMPARISON:  MRI thoracic spine December 19, 2016 FINDINGS: CT HEAD FINDINGS- mildly motion degraded examination. BRAIN: No intraparenchymal hemorrhage, mass effect nor midline shift. The ventricles and sulci are normal for age. Patchy supratentorial white matter hypodensities less than expected for patient's age, though non-specific are most compatible with chronic small vessel ischemic disease. No acute large vascular territory infarcts. No abnormal extra-axial fluid collections. Basal cisterns are patent. VASCULAR: Mild calcific atherosclerosis of the carotid siphons. SKULL: No skull fracture. No significant scalp soft tissue swelling. SINUSES/ORBITS: Trace paranasal sinus mucosal thickening. Mastoid air cells are well aerated.The included ocular globes and orbital contents are non-suspicious. Status post bilateral ocular lens implants. OTHER: None. CTA NECK AORTIC ARCH: Normal appearance of the thoracic arch, 2 vessel arch is a normal variant. Mild calcific atherosclerosis The origins of the innominate, left Common carotid artery and subclavian artery are widely patent. RIGHT CAROTID SYSTEM: Common carotid artery is widely patent. Normal appearance of the carotid bifurcation  without hemodynamically significant stenosis by NASCET criteria, mild calcific atherosclerosis. Normal appearance of the internal carotid artery. LEFT CAROTID SYSTEM: Common carotid artery is widely patent. Mild calcific atherosclerosis LEFT carotid bifurcation. Within 1 cm though origin is a 1 cm segment of 60% stenosis due to calcific atherosclerosis by NASCET criteria. Patent internal carotid artery. VERTEBRAL ARTERIES:Moderate stenosis RIGHT vertebral artery origin. Codominant vertebral artery's. SKELETON: New moderate to severe T6 compression fracture with 50-75% height loss. Mild T4 compression fracture, moderate to severe T5 compression fracture, status post cement augmentation at these 2 levels. OTHER NECK: Soft tissues of the neck are non-acute though, not tailored for evaluation. Linear secretions RIGHT mainstem bronchus. Mildly heterogeneous lung attenuation seen with small airway disease. CTA HEAD ANTERIOR CIRCULATION: Patent cervical internal carotid arteries, petrous, cavernous and supra clinoid internal carotid arteries. Widely patent anterior communicating artery. Patent anterior and middle cerebral arteries. No large vessel occlusion, significant stenosis, contrast extravasation or aneurysm. POSTERIOR CIRCULATION: Patent vertebral arteries, vertebrobasilar junction and basilar artery, as well as main branch vessels. Patent posterior cerebral arteries. No large vessel occlusion, significant stenosis, contrast extravasation or aneurysm. VENOUS SINUSES: Major dural venous sinuses are patent though not tailored for evaluation on this angiographic examination. ANATOMIC VARIANTS: Diminutive bilateral P1 segments with robust bilateral posterior communicating artery's. DELAYED PHASE: No abnormal intracranial enhancement. MIP images reviewed. IMPRESSION: CT HEAD: 1. Negative CT HEAD with and without contrast for age. CTA NECK: 1. 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 2. New, possibly acute T6  moderate to severe compression fracture. Status post T4 and T5 cement augmentation. CTA  HEAD: 1. No emergent large vessel occlusion or severe stenosis. Complete circle of Willis. Aortic Atherosclerosis (ICD10-I70.0). Critical Value/emergent results were called by telephone at the time of interpretation on 02/26/2017 at 11:02 pm to Dr. Rory Percy, Neurology, who verbally acknowledged these results. Electronically Signed   By: Elon Alas M.D.   On: 02/26/2017 23:02   Mr Brain Wo Contrast  Result Date: 02/27/2017 CLINICAL DATA:  Confusion, speech difficulties and drooping head. History of diabetes, reflex sympathetic dystrophy and fibromyalgia. EXAM: MRI HEAD WITHOUT CONTRAST TECHNIQUE: Multiplanar, multiecho pulse sequences of the brain and surrounding structures were obtained without intravenous contrast. COMPARISON:  CT HEAD February 26, 2017 FINDINGS: Sequences very from mildly to moderately motion degraded. BRAIN: No reduced diffusion to suggest acute ischemia. No susceptibility artifact to suggest hemorrhage though, SWAN sequence is moderately motion degraded. The ventricles and sulci are normal for patient's age. Patchy supratentorial white matter FLAIR T2 hyperintensities. No suspicious parenchymal signal, mass or mass effect. No abnormal extra-axial fluid collections. VASCULAR: Normal major intracranial vascular flow voids present at skull base. SKULL AND UPPER CERVICAL SPINE: No abnormal sellar expansion. No suspicious calvarial bone marrow signal. Craniocervical junction maintained. SINUSES/ORBITS: Trace paranasal sinus mucosal thickening. The included ocular globes and orbital contents are non-suspicious. Status post bilateral ocular lens implants. OTHER: Patient is edentulous. IMPRESSION: 1. No acute intracranial process on this motion degraded examination. 2. Mild chronic small vessel ischemic disease. Electronically Signed   By: Elon Alas M.D.   On: 02/27/2017 05:26   Mr Thoracic Spine Wo  Contrast  Result Date: 02/15/2017 CLINICAL DATA:  Back pain and compression fracture. Status post augmentation at T4 and T5. EXAM: MRI THORACIC SPINE WITHOUT CONTRAST TECHNIQUE: Multiplanar, multisequence MR imaging of the thoracic spine was performed. No intravenous contrast was administered. COMPARISON:  Thoracic spine MRI 12/19/2016 FINDINGS: Alignment: There is exaggerated thoracic kyphosis centered at the T5 level. No static subluxation. Vertebrae: There is a T6 compression fracture that is new compared to prior studies. T4 and T5 compression fractures are status post augmentation. There are large Schmorl's nodes at the superior endplates T11 and P10. There is chronic compression at T11 resulting in approximately 30% height loss. There are scattered hemangiomata throughout the thoracic spine. Cord:  Normal signal and caliber. Paraspinal and other soft tissues: Negative. Disc levels: T4-T5: Retropulsion of the superior T5 endplate narrows the ventral thecal sac. No spinal canal stenosis. The neural foramina are patent. There is no spinal canal or neural foraminal stenosis at the other thoracic levels. IMPRESSION: 1. New compression fracture at T6 with approximately 60% height loss and moderate edema. Minimal retropulsion without associated stenosis. 2. Augmented compression fractures at T4 and T5 with retropulsion of the superior T5 endplate without associated stenosis. Electronically Signed   By: Ulyses Jarred M.D.   On: 02/15/2017 14:12   Dg Chest Port 1 View  Result Date: 02/26/2017 CLINICAL DATA:  Altered mental status, somnolent. EXAM: PORTABLE CHEST 1 VIEW COMPARISON:  MRI 02/15/2017 of the thoracic spine, CXR 01/21/2008. FINDINGS: Lung volumes are low with mild vascular congestion centrally. The thoracic aorta is slightly uncoiled in appearance and atherosclerotic. Vertebral augmentation is noted with cement in the mid to upper thoracic spine. IMPRESSION: 1. Low lung volumes with mild central  vascular congestion. No acute pulmonary disease. 2. Aortic atherosclerosis. 3. New kyphoplasty cement projects over the mid to upper thoracic spine. Electronically Signed   By: Ashley Royalty M.D.   On: 02/26/2017 22:19   Ir Radiologist Eval & Mgmt  Result Date: 02/03/2017 EXAM: ESTABLISHED PATIENT OFFICE VISIT CHIEF COMPLAINT: New back pain. Current Pain Level: 1-10 HISTORY OF PRESENT ILLNESS: The patient is a 71 year old lady who is status post vertebral body augmentation for painful compression fracture at T4 and T5 on 01/14/2017. The patient returns in follow-up accompanied by her husband. The patient reports significant improvement of 50-60% for 2 weeks after the procedure. She was ambulating better and sleeping comfortably. Last week the patient reports while getting up rather suddenly, she felt a new pain in the vicinity of the previously treated levels. She describes this pain as almost a constant burning achy pain which worsens with prolonged standing or stooping. The pain is also precipitated and aggravated by coughing or bending forward. There is circumferential radiation to the level of the lateral aspect her rib cage. She denies any autonomic dysfunction of bowel or bladder activities. She denies any radiation of pain into the lower extremities. Her appetite appears normal. Her weight is relatively steady. She has noted some chills over the past day or so which she feels may be related to cold like symptoms that she has been having for the past couple of days. There is no associated nausea or vomiting. She denies any symptoms or weakness in arms or legs or difficulty with balance. She saw her orthopedic surgeon yesterday and had some plain films done. Past medical history, previous surgery, social history, family history as per 2 weeks ago unchanged. Present medications: Baclofen, calcitonin nasal spray, Welchol, cyanocobalamin B12 injection, Bentyl, Flonase, magnesium oxide, metformin, methadone,  promethazine, ranitidine, trazodone. She denies any allergies to foods or medicines. Her review of systems essentially negative unless as mentioned above. PHYSICAL EXAMINATION: Appears to be in mild distress on account of the pain. Affect appropriate for the situation. Neurologically appears grossly intact. Exhibits exquisite tenderness in the T6-T7 paraspinous region bilaterally on palpation. ASSESSMENT AND PLAN: Patient's immediate post treatment radiographs were reviewed. In view of the patient's clinical history and examination, the patient and the spouse were informed that further evaluation with an MRI of the thoracic spine was recommended to evaluate for a new fracture that may explain the patient's new symptoms and the midthoracic pain after having achieved moderate but significant relief after having had T4 and T5 levels treated with augmentation. Both the spouse and the patient are agreeable to proceed with an MRI of the thoracic spine without infusion. In the meantime, she has been asked to continue with conservative measures such as using a walker all the time, and to refrain from stooping, bending or lifting 10 pounds. She was also asked to refrain from driving. Questions were answered to their satisfaction. They leave with good understanding and agreement with the above management plan. Further recommendations to follow the results of the MRI of the thoracic spine. They were asked to call should they have any concerns or questions. Electronically Signed   By: Luanne Bras M.D.   On: 02/02/2017 13:33       Discharge Exam: Vitals:   02/27/17 0330 02/27/17 0634  BP: 117/85 (!) 165/53  Pulse: 77 63  Resp: (!) 24 18  Temp:  98.2 F (36.8 C)  SpO2: 90% 96%   Vitals:   02/27/17 0215 02/27/17 0300 02/27/17 0330 02/27/17 0634  BP: (!) 124/49 (!) 124/49 117/85 (!) 165/53  Pulse: 70 65 77 63  Resp: 20 (!) 7 (!) 24 18  Temp:    98.2 F (36.8 C)  TempSrc:    Oral  SpO2: 93%  95% 90% 96%   Weight:      Height:        General: Pt is alert, awake, not in acute distress Cardiovascular: RRR, S1/S2 +, no rubs, no gallops Respiratory: CTA bilaterally, no wheezing, no rhonchi Abdominal: Soft, NT, ND, bowel sounds + Extremities: no edema, no cyanosis    The results of significant diagnostics from this hospitalization (including imaging, microbiology, ancillary and laboratory) are listed below for reference.     Microbiology: No results found for this or any previous visit (from the past 240 hour(s)).   Labs: BNP (last 3 results) No results for input(s): BNP in the last 8760 hours. Basic Metabolic Panel:  Recent Labs Lab 02/26/17 2149 02/26/17 2203  NA 141 141  K 3.8 3.8  CL 108 109  CO2 24  --   GLUCOSE 148* 138*  BUN 23* 24*  CREATININE 1.29* 1.40*  CALCIUM 9.4  --    Liver Function Tests:  Recent Labs Lab 02/26/17 2149  AST 15  ALT 13*  ALKPHOS 89  BILITOT 0.4  PROT 7.1  ALBUMIN 3.5   No results for input(s): LIPASE, AMYLASE in the last 168 hours.  Recent Labs Lab 02/27/17 0108  AMMONIA 16   CBC:  Recent Labs Lab 02/26/17 2149 02/26/17 2203  WBC 7.7  --   HGB 11.4* 11.2*  HCT 34.6* 33.0*  MCV 90.3  --   PLT 291  --    Cardiac Enzymes: No results for input(s): CKTOTAL, CKMB, CKMBINDEX, TROPONINI in the last 168 hours. BNP: Invalid input(s): POCBNP CBG:  Recent Labs Lab 02/26/17 2150  GLUCAP 152*   D-Dimer No results for input(s): DDIMER in the last 72 hours. Hgb A1c No results for input(s): HGBA1C in the last 72 hours. Lipid Profile No results for input(s): CHOL, HDL, LDLCALC, TRIG, CHOLHDL, LDLDIRECT in the last 72 hours. Thyroid function studies No results for input(s): TSH, T4TOTAL, T3FREE, THYROIDAB in the last 72 hours.  Invalid input(s): FREET3 Anemia work up No results for input(s): VITAMINB12, FOLATE, FERRITIN, TIBC, IRON, RETICCTPCT in the last 72 hours. Urinalysis No results found for: COLORURINE,  APPEARANCEUR, LABSPEC, [place], GLUCOSEU, HGBUR, BILIRUBINUR, KETONESUR, PROTEINUR, UROBILINOGEN, NITRITE, LEUKOCYTESUR Sepsis Labs Invalid input(s): PROCALCITONIN,  WBC,  LACTICIDVEN Microbiology No results found for this or any previous visit (from the past 240 hour(s)).   Time coordinating discharge: Over 30 minutes  SIGNED:   Debbe Odea, MD  [facility] 02/27/2017, 2:09 PM Pager   If 7PM-7AM, please contact night-coverage [email or web address] Password [email or web address]

## 2017-03-01 ENCOUNTER — Other Ambulatory Visit (HOSPITAL_COMMUNITY): Payer: Self-pay | Admitting: Interventional Radiology

## 2017-03-01 ENCOUNTER — Encounter (HOSPITAL_COMMUNITY): Payer: Self-pay

## 2017-03-01 ENCOUNTER — Ambulatory Visit (HOSPITAL_COMMUNITY)
Admission: RE | Admit: 2017-03-01 | Discharge: 2017-03-01 | Disposition: A | Discharge status: Home / Self Care | Payer: Medicare Other | Source: Ambulatory Visit | Attending: Interventional Radiology | Admitting: Interventional Radiology

## 2017-03-01 DIAGNOSIS — E1140 Type 2 diabetes mellitus with diabetic neuropathy, unspecified: Secondary | ICD-10-CM | POA: Diagnosis not present

## 2017-03-01 DIAGNOSIS — M546 Pain in thoracic spine: Secondary | ICD-10-CM

## 2017-03-01 DIAGNOSIS — Z87891 Personal history of nicotine dependence: Secondary | ICD-10-CM | POA: Diagnosis not present

## 2017-03-01 DIAGNOSIS — M797 Fibromyalgia: Secondary | ICD-10-CM | POA: Insufficient documentation

## 2017-03-01 DIAGNOSIS — M4854XA Collapsed vertebra, not elsewhere classified, thoracic region, initial encounter for fracture: Secondary | ICD-10-CM | POA: Diagnosis not present

## 2017-03-01 DIAGNOSIS — S22050A Wedge compression fracture of T5-T6 vertebra, initial encounter for closed fracture: Secondary | ICD-10-CM

## 2017-03-01 DIAGNOSIS — G9050 Complex regional pain syndrome I, unspecified: Secondary | ICD-10-CM | POA: Diagnosis not present

## 2017-03-01 DIAGNOSIS — Z7984 Long term (current) use of oral hypoglycemic drugs: Secondary | ICD-10-CM | POA: Diagnosis not present

## 2017-03-01 HISTORY — PX: IR VERTEBROPLASTY CERV/THOR BX INC UNI/BIL INC/INJECT/IMAGING: IMG5515

## 2017-03-01 LAB — CBC
HCT: 34.4 % — ABNORMAL LOW (ref 36.0–46.0)
Hemoglobin: 11.3 g/dL — ABNORMAL LOW (ref 12.0–15.0)
MCH: 29.7 pg (ref 26.0–34.0)
MCHC: 32.8 g/dL (ref 30.0–36.0)
MCV: 90.5 fL (ref 78.0–100.0)
PLATELETS: 283 10*3/uL (ref 150–400)
RBC: 3.8 MIL/uL — AB (ref 3.87–5.11)
RDW: 13.8 % (ref 11.5–15.5)
WBC: 7.3 10*3/uL (ref 4.0–10.5)

## 2017-03-01 LAB — BASIC METABOLIC PANEL
Anion gap: 10 (ref 5–15)
BUN: 18 mg/dL (ref 6–20)
CHLORIDE: 107 mmol/L (ref 101–111)
CO2: 23 mmol/L (ref 22–32)
CREATININE: 1.27 mg/dL — AB (ref 0.44–1.00)
Calcium: 9.1 mg/dL (ref 8.9–10.3)
GFR calc Af Amer: 48 mL/min — ABNORMAL LOW (ref >60)
GFR calc non Af Amer: 41 mL/min — ABNORMAL LOW (ref >60)
Glucose, Bld: 120 mg/dL — ABNORMAL HIGH (ref 65–99)
Potassium: 4.2 mmol/L (ref 3.5–5.1)
Sodium: 140 mmol/L (ref 135–145)

## 2017-03-01 LAB — GLUCOSE, CAPILLARY
Glucose-Capillary: 120 mg/dL — ABNORMAL HIGH (ref 65–99)
Glucose-Capillary: 104 mg/dL — ABNORMAL HIGH (ref 65–99)

## 2017-03-01 LAB — PROTIME-INR
INR: 1.05
Prothrombin Time: 13.7 seconds (ref 11.4–15.2)

## 2017-03-01 LAB — APTT: aPTT: 31 seconds (ref 24–36)

## 2017-03-01 MED ORDER — BUPIVACAINE HCL 0.25 % IJ SOLN
INTRAMUSCULAR | Status: AC | PRN
  Administered 2017-03-01: 10 mL

## 2017-03-01 MED ORDER — MIDAZOLAM HCL 2 MG/2ML IJ SOLN
INTRAMUSCULAR | Status: AC | PRN
  Administered 2017-03-01 (×2): 0.5 mg via INTRAVENOUS

## 2017-03-01 MED ORDER — TOBRAMYCIN SULFATE 1.2 G IJ SOLR
INTRAMUSCULAR | Status: AC
  Filled 2017-03-01: qty 1.2

## 2017-03-01 MED ORDER — HYDROMORPHONE HCL 1 MG/ML IJ SOLN
INTRAMUSCULAR | Status: DC
Start: 2017-03-01 — End: 2017-03-01
  Filled 2017-03-01: qty 0.5

## 2017-03-01 MED ORDER — SODIUM CHLORIDE 0.9 % IV SOLN: INTRAVENOUS | Status: DC

## 2017-03-01 MED ORDER — FENTANYL CITRATE (PF) 100 MCG/2ML IJ SOLN
INTRAMUSCULAR | Status: AC
  Filled 2017-03-01: qty 2

## 2017-03-01 MED ORDER — FENTANYL CITRATE (PF) 100 MCG/2ML IJ SOLN
INTRAMUSCULAR | Status: AC | PRN
  Administered 2017-03-01: 25 ug via INTRAVENOUS
  Administered 2017-03-01: 50 ug via INTRAVENOUS

## 2017-03-01 MED ORDER — GELATIN ABSORBABLE 12-7 MM EX MISC
CUTANEOUS | Status: AC
  Filled 2017-03-01: qty 1

## 2017-03-01 MED ORDER — IOPAMIDOL (ISOVUE-300) INJECTION 61%
INTRAVENOUS | Status: AC
  Administered 2017-03-01: 10 mL
  Filled 2017-03-01: qty 50

## 2017-03-01 MED ORDER — MIDAZOLAM HCL 2 MG/2ML IJ SOLN
INTRAMUSCULAR | Status: AC
  Filled 2017-03-01: qty 2

## 2017-03-01 MED ORDER — METHADONE HCL 5 MG PO TABS
5.0000 mg | ORAL_TABLET | Freq: Once | ORAL | Status: AC
  Administered 2017-03-01: 5 mg via ORAL
  Filled 2017-03-01: qty 1

## 2017-03-01 MED ORDER — BUPIVACAINE HCL (PF) 0.25 % IJ SOLN
INTRAMUSCULAR | Status: AC
  Filled 2017-03-01: qty 30

## 2017-03-01 MED ORDER — CEFAZOLIN SODIUM-DEXTROSE 2-4 GM/100ML-% IV SOLN
2.0000 g | INTRAVENOUS | Status: AC
  Administered 2017-03-01: 2 g via INTRAVENOUS

## 2017-03-01 MED ORDER — CEFAZOLIN SODIUM-DEXTROSE 2-4 GM/100ML-% IV SOLN
INTRAVENOUS | Status: AC
  Filled 2017-03-01: qty 100

## 2017-03-01 MED ORDER — SODIUM CHLORIDE 0.9 % IV SOLN: INTRAVENOUS | Status: AC

## 2017-03-01 NOTE — Sedation Documentation (Signed)
Patient is resting comfortably. 

## 2017-03-01 NOTE — H&P (Signed)
Chief Complaint: Patient was seen in consultation today for T6 VP at the request of [place]  Referring Physician(s): Deveshwar,Sanjeev  Supervising Physician: Luanne Bras  Patient Status: [facility] - Out-pt  History of Present Illness: Danielle Fuller is a 71 y.o. female with hx of multiple vertebral fractures. Was recently treated for T4-T5 VP and did well. Then developed new pain and was found to have acute T6 fracture. She is now scheduled for T6 vertebroplasty PMHx reviewed. Was just I hospital for AMS but she says that was medication related. She now has her meds straight and feels fine except her pain. Husband at bedside Has been NPO this am  Past Medical History:  Diagnosis Date  . Diabetes mellitus without complication ([place])   . Fibromyalgia   . Neuropathy   . RSD (reflex sympathetic dystrophy)     Past Surgical History:  Procedure Laterality Date  . ANKLE FRACTURE SURGERY    . CARPAL TUNNEL RELEASE    . IR RADIOLOGIST EVAL & MGMT  01/07/2017  . IR RADIOLOGIST EVAL & MGMT  02/02/2017  . IR VERTEBROPLASTY CERV/THOR BX INC UNI/BIL INC/INJECT/IMAGING  01/14/2017  . IR VERTEBROPLASTY EA ADDL (T&LS) BX INC UNI/BIL INC INJECT/IMAGING  01/14/2017  . KNEE ARTHROSCOPY      Allergies: Patient has no known allergies.  Medications: Prior to Admission medications   Medication Sig Start Date End Date Taking? Authorizing Provider  baclofen (LIORESAL) 20 MG tablet Take 20 mg by mouth 3 (three) times daily as needed for muscle spasms.   Yes [provider]  calcitonin, salmon, (MIACALCIN/FORTICAL) 200 UNIT/ACT nasal spray Place 1 spray into alternate nostrils daily.   Yes [provider]  colesevelam (WELCHOL) 625 MG tablet Take 1,875 mg by mouth 2 (two) times daily with a meal.   Yes [provider]  Cyanocobalamin (B-12 COMPLIANCE INJECTION) 1000 MCG/ML KIT Inject 1 mL as directed every 30 (thirty) days.   Yes [provider]    diclofenac sodium (VOLTAREN) 1 % GEL Apply 2 g topically 4 (four) times daily. 02/27/17  Yes Debbe Odea, MD  dicyclomine (BENTYL) 20 MG tablet Take 20 mg by mouth 3 (three) times daily before meals.   Yes [provider]  fluticasone (FLONASE) 50 MCG/ACT nasal spray Place 2 sprays into both nostrils daily.   Yes [provider]  magnesium oxide (MAG-OX) 400 MG tablet Take 400 mg by mouth 2 (two) times daily.   Yes [provider]  metFORMIN (GLUCOPHAGE-XR) 500 MG 24 hr tablet Take 500-1,000 mg by mouth See admin instructions. Take 500 mg every morning  Take 1000 mg every evening    Yes [provider]  methadone (DOLOPHINE) 5 MG tablet Take 5 mg by mouth 5 (five) times daily.   Yes [provider]  promethazine (PHENERGAN) 25 MG tablet Take 25 mg by mouth 3 (three) times daily as needed for nausea or vomiting.   Yes [provider]  ranitidine (ZANTAC) 150 MG tablet Take 150 mg by mouth 2 (two) times daily.   Yes [provider]  traZODone (DESYREL) 100 MG tablet Take 100 mg by mouth at bedtime.   Yes [provider]     Family History  Problem Relation Age of Onset  . Stroke Mother   . Heart disease Father     Social History   Social History  . Marital status: Married    Spouse name: N/A  . Number of children: N/A  . Years  of education: N/A   Social History Main Topics  . Smoking status: Former Smoker    Types: Cigarettes  . Smokeless tobacco: Never Used  . Alcohol use No  . Drug use: No  . Sexual activity: Not Asked   Other Topics Concern  . None   Social History Narrative  . None     Review of Systems: A 12 point ROS discussed and pertinent positives are indicated in the HPI above.  All other systems are negative.  Review of Systems  Vital Signs: BP (!) 181/73 (BP Location: Right Arm) Comment: (R)arm  Pulse 78   Temp 98.2 F (36.8 C) (Oral)   Ht 5' (1.524 m)   Wt 115 lb (52.2 kg)   SpO2  93%   BMI 22.46 kg/m   Physical Exam  Constitutional: She is oriented to person, place, and time. She appears well-developed. No distress.  HENT:  Head: Normocephalic.  Mouth/Throat: Oropharynx is clear and moist.  Neck: Normal range of motion. No JVD present. No tracheal deviation present.  Cardiovascular: Normal rate, regular rhythm and normal heart sounds.   Pulmonary/Chest: Effort normal and breath sounds normal. No respiratory distress.  Neurological: She is alert and oriented to person, place, and time.  Skin: Skin is warm and dry.  Psychiatric: She has a normal mood and affect. Judgment normal.    Mallampati Score:  MD Evaluation Airway: WNL Heart: WNL Abdomen: WNL Chest/ Lungs: WNL ASA  Classification: 3 Mallampati/Airway Score: Two  Imaging: Ct Angio Head W Or Wo Contrast  Result Date: 02/26/2017 CLINICAL DATA:  Somnolent. Assess for basilar artery thrombosis. Status post vertebral body cement augmentation January 14, 2017. EXAM: CT ANGIOGRAPHY HEAD AND NECK TECHNIQUE: Multidetector CT imaging of the head and neck was performed using the standard protocol during bolus administration of intravenous contrast. Multiplanar CT image reconstructions and MIPs were obtained to evaluate the vascular anatomy. Carotid stenosis measurements (when applicable) are obtained utilizing NASCET criteria, using the distal internal carotid diameter as the denominator. CONTRAST:  50 cc Isovue 370 COMPARISON:  MRI thoracic spine December 19, 2016 FINDINGS: CT HEAD FINDINGS- mildly motion degraded examination. BRAIN: No intraparenchymal hemorrhage, mass effect nor midline shift. The ventricles and sulci are normal for age. Patchy supratentorial white matter hypodensities less than expected for patient's age, though non-specific are most compatible with chronic small vessel ischemic disease. No acute large vascular territory infarcts. No abnormal extra-axial fluid collections. Basal cisterns are patent.  VASCULAR: Mild calcific atherosclerosis of the carotid siphons. SKULL: No skull fracture. No significant scalp soft tissue swelling. SINUSES/ORBITS: Trace paranasal sinus mucosal thickening. Mastoid air cells are well aerated.The included ocular globes and orbital contents are non-suspicious. Status post bilateral ocular lens implants. OTHER: None. CTA NECK AORTIC ARCH: Normal appearance of the thoracic arch, 2 vessel arch is a normal variant. Mild calcific atherosclerosis The origins of the innominate, left Common carotid artery and subclavian artery are widely patent. RIGHT CAROTID SYSTEM: Common carotid artery is widely patent. Normal appearance of the carotid bifurcation without hemodynamically significant stenosis by NASCET criteria, mild calcific atherosclerosis. Normal appearance of the internal carotid artery. LEFT CAROTID SYSTEM: Common carotid artery is widely patent. Mild calcific atherosclerosis LEFT carotid bifurcation. Within 1 cm though origin is a 1 cm segment of 60% stenosis due to calcific atherosclerosis by NASCET criteria. Patent internal carotid artery. VERTEBRAL ARTERIES:Moderate stenosis RIGHT vertebral artery origin. Codominant vertebral artery's. SKELETON: New moderate to severe T6 compression fracture with 50-75% height loss. Mild T4 compression fracture,  moderate to severe T5 compression fracture, status post cement augmentation at these 2 levels. OTHER NECK: Soft tissues of the neck are non-acute though, not tailored for evaluation. Linear secretions RIGHT mainstem bronchus. Mildly heterogeneous lung attenuation seen with small airway disease. CTA HEAD ANTERIOR CIRCULATION: Patent cervical internal carotid arteries, petrous, cavernous and supra clinoid internal carotid arteries. Widely patent anterior communicating artery. Patent anterior and middle cerebral arteries. No large vessel occlusion, significant stenosis, contrast extravasation or aneurysm. POSTERIOR CIRCULATION: Patent  vertebral arteries, vertebrobasilar junction and basilar artery, as well as main branch vessels. Patent posterior cerebral arteries. No large vessel occlusion, significant stenosis, contrast extravasation or aneurysm. VENOUS SINUSES: Major dural venous sinuses are patent though not tailored for evaluation on this angiographic examination. ANATOMIC VARIANTS: Diminutive bilateral P1 segments with robust bilateral posterior communicating artery's. DELAYED PHASE: No abnormal intracranial enhancement. MIP images reviewed. IMPRESSION: CT HEAD: 1. Negative CT HEAD with and without contrast for age. CTA NECK: 1. 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 2. New, possibly acute T6 moderate to severe compression fracture. Status post T4 and T5 cement augmentation. CTA HEAD: 1. No emergent large vessel occlusion or severe stenosis. Complete circle of Willis. Aortic Atherosclerosis (ICD10-I70.0). Critical Value/emergent results were called by telephone at the time of interpretation on 02/26/2017 at 11:02 pm to Dr. Wilford Corner, Neurology, who verbally acknowledged these results. Electronically Signed   By: Awilda Metro M.D.   On: 02/26/2017 23:02   Ct Angio Neck W And/or Wo Contrast  Result Date: 02/26/2017 CLINICAL DATA:  Somnolent. Assess for basilar artery thrombosis. Status post vertebral body cement augmentation January 14, 2017. EXAM: CT ANGIOGRAPHY HEAD AND NECK TECHNIQUE: Multidetector CT imaging of the head and neck was performed using the standard protocol during bolus administration of intravenous contrast. Multiplanar CT image reconstructions and MIPs were obtained to evaluate the vascular anatomy. Carotid stenosis measurements (when applicable) are obtained utilizing NASCET criteria, using the distal internal carotid diameter as the denominator. CONTRAST:  50 cc Isovue 370 COMPARISON:  MRI thoracic spine December 19, 2016 FINDINGS: CT HEAD FINDINGS- mildly motion degraded examination. BRAIN: No intraparenchymal  hemorrhage, mass effect nor midline shift. The ventricles and sulci are normal for age. Patchy supratentorial white matter hypodensities less than expected for patient's age, though non-specific are most compatible with chronic small vessel ischemic disease. No acute large vascular territory infarcts. No abnormal extra-axial fluid collections. Basal cisterns are patent. VASCULAR: Mild calcific atherosclerosis of the carotid siphons. SKULL: No skull fracture. No significant scalp soft tissue swelling. SINUSES/ORBITS: Trace paranasal sinus mucosal thickening. Mastoid air cells are well aerated.The included ocular globes and orbital contents are non-suspicious. Status post bilateral ocular lens implants. OTHER: None. CTA NECK AORTIC ARCH: Normal appearance of the thoracic arch, 2 vessel arch is a normal variant. Mild calcific atherosclerosis The origins of the innominate, left Common carotid artery and subclavian artery are widely patent. RIGHT CAROTID SYSTEM: Common carotid artery is widely patent. Normal appearance of the carotid bifurcation without hemodynamically significant stenosis by NASCET criteria, mild calcific atherosclerosis. Normal appearance of the internal carotid artery. LEFT CAROTID SYSTEM: Common carotid artery is widely patent. Mild calcific atherosclerosis LEFT carotid bifurcation. Within 1 cm though origin is a 1 cm segment of 60% stenosis due to calcific atherosclerosis by NASCET criteria. Patent internal carotid artery. VERTEBRAL ARTERIES:Moderate stenosis RIGHT vertebral artery origin. Codominant vertebral artery's. SKELETON: New moderate to severe T6 compression fracture with 50-75% height loss. Mild T4 compression fracture, moderate to severe T5 compression fracture, status post  cement augmentation at these 2 levels. OTHER NECK: Soft tissues of the neck are non-acute though, not tailored for evaluation. Linear secretions RIGHT mainstem bronchus. Mildly heterogeneous lung attenuation seen with  small airway disease. CTA HEAD ANTERIOR CIRCULATION: Patent cervical internal carotid arteries, petrous, cavernous and supra clinoid internal carotid arteries. Widely patent anterior communicating artery. Patent anterior and middle cerebral arteries. No large vessel occlusion, significant stenosis, contrast extravasation or aneurysm. POSTERIOR CIRCULATION: Patent vertebral arteries, vertebrobasilar junction and basilar artery, as well as main branch vessels. Patent posterior cerebral arteries. No large vessel occlusion, significant stenosis, contrast extravasation or aneurysm. VENOUS SINUSES: Major dural venous sinuses are patent though not tailored for evaluation on this angiographic examination. ANATOMIC VARIANTS: Diminutive bilateral P1 segments with robust bilateral posterior communicating artery's. DELAYED PHASE: No abnormal intracranial enhancement. MIP images reviewed. IMPRESSION: CT HEAD: 1. Negative CT HEAD with and without contrast for age. CTA NECK: 1. 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 2. New, possibly acute T6 moderate to severe compression fracture. Status post T4 and T5 cement augmentation. CTA HEAD: 1. No emergent large vessel occlusion or severe stenosis. Complete circle of Willis. Aortic Atherosclerosis (ICD10-I70.0). Critical Value/emergent results were called by telephone at the time of interpretation on 02/26/2017 at 11:02 pm to Dr. Rory Percy, Neurology, who verbally acknowledged these results. Electronically Signed   By: Elon Alas M.D.   On: 02/26/2017 23:02   Mr Brain Wo Contrast  Result Date: 02/27/2017 CLINICAL DATA:  Confusion, speech difficulties and drooping head. History of diabetes, reflex sympathetic dystrophy and fibromyalgia. EXAM: MRI HEAD WITHOUT CONTRAST TECHNIQUE: Multiplanar, multiecho pulse sequences of the brain and surrounding structures were obtained without intravenous contrast. COMPARISON:  CT HEAD February 26, 2017 FINDINGS: Sequences very from  mildly to moderately motion degraded. BRAIN: No reduced diffusion to suggest acute ischemia. No susceptibility artifact to suggest hemorrhage though, SWAN sequence is moderately motion degraded. The ventricles and sulci are normal for patient's age. Patchy supratentorial white matter FLAIR T2 hyperintensities. No suspicious parenchymal signal, mass or mass effect. No abnormal extra-axial fluid collections. VASCULAR: Normal major intracranial vascular flow voids present at skull base. SKULL AND UPPER CERVICAL SPINE: No abnormal sellar expansion. No suspicious calvarial bone marrow signal. Craniocervical junction maintained. SINUSES/ORBITS: Trace paranasal sinus mucosal thickening. The included ocular globes and orbital contents are non-suspicious. Status post bilateral ocular lens implants. OTHER: Patient is edentulous. IMPRESSION: 1. No acute intracranial process on this motion degraded examination. 2. Mild chronic small vessel ischemic disease. Electronically Signed   By: Elon Alas M.D.   On: 02/27/2017 05:26   Mr Thoracic Spine Wo Contrast  Result Date: 02/15/2017 CLINICAL DATA:  Back pain and compression fracture. Status post augmentation at T4 and T5. EXAM: MRI THORACIC SPINE WITHOUT CONTRAST TECHNIQUE: Multiplanar, multisequence MR imaging of the thoracic spine was performed. No intravenous contrast was administered. COMPARISON:  Thoracic spine MRI 12/19/2016 FINDINGS: Alignment: There is exaggerated thoracic kyphosis centered at the T5 level. No static subluxation. Vertebrae: There is a T6 compression fracture that is new compared to prior studies. T4 and T5 compression fractures are status post augmentation. There are large Schmorl's nodes at the superior endplates T11 and Z66. There is chronic compression at T11 resulting in approximately 30% height loss. There are scattered hemangiomata throughout the thoracic spine. Cord:  Normal signal and caliber. Paraspinal and other soft tissues: Negative.  Disc levels: T4-T5: Retropulsion of the superior T5 endplate narrows the ventral thecal sac. No spinal canal stenosis. The neural foramina are patent.  There is no spinal canal or neural foraminal stenosis at the other thoracic levels. IMPRESSION: 1. New compression fracture at T6 with approximately 60% height loss and moderate edema. Minimal retropulsion without associated stenosis. 2. Augmented compression fractures at T4 and T5 with retropulsion of the superior T5 endplate without associated stenosis. Electronically Signed   By: Ulyses Jarred M.D.   On: 02/15/2017 14:12   Dg Chest Port 1 View  Result Date: 02/26/2017 CLINICAL DATA:  Altered mental status, somnolent. EXAM: PORTABLE CHEST 1 VIEW COMPARISON:  MRI 02/15/2017 of the thoracic spine, CXR 01/21/2008. FINDINGS: Lung volumes are low with mild vascular congestion centrally. The thoracic aorta is slightly uncoiled in appearance and atherosclerotic. Vertebral augmentation is noted with cement in the mid to upper thoracic spine. IMPRESSION: 1. Low lung volumes with mild central vascular congestion. No acute pulmonary disease. 2. Aortic atherosclerosis. 3. New kyphoplasty cement projects over the mid to upper thoracic spine. Electronically Signed   By: Ashley Royalty M.D.   On: 02/26/2017 22:19   Ir Radiologist Eval & Mgmt  Result Date: 02/03/2017 EXAM: ESTABLISHED PATIENT OFFICE VISIT CHIEF COMPLAINT: New back pain. Current Pain Level: 1-10 HISTORY OF PRESENT ILLNESS: The patient is a 71 year old lady who is status post vertebral body augmentation for painful compression fracture at T4 and T5 on 01/14/2017. The patient returns in follow-up accompanied by her husband. The patient reports significant improvement of 50-60% for 2 weeks after the procedure. She was ambulating better and sleeping comfortably. Last week the patient reports while getting up rather suddenly, she felt a new pain in the vicinity of the previously treated levels. She describes this  pain as almost a constant burning achy pain which worsens with prolonged standing or stooping. The pain is also precipitated and aggravated by coughing or bending forward. There is circumferential radiation to the level of the lateral aspect her rib cage. She denies any autonomic dysfunction of bowel or bladder activities. She denies any radiation of pain into the lower extremities. Her appetite appears normal. Her weight is relatively steady. She has noted some chills over the past day or so which she feels may be related to cold like symptoms that she has been having for the past couple of days. There is no associated nausea or vomiting. She denies any symptoms or weakness in arms or legs or difficulty with balance. She saw her orthopedic surgeon yesterday and had some plain films done. Past medical history, previous surgery, social history, family history as per 2 weeks ago unchanged. Present medications: Baclofen, calcitonin nasal spray, Welchol, cyanocobalamin B12 injection, Bentyl, Flonase, magnesium oxide, metformin, methadone, promethazine, ranitidine, trazodone. She denies any allergies to foods or medicines. Her review of systems essentially negative unless as mentioned above. PHYSICAL EXAMINATION: Appears to be in mild distress on account of the pain. Affect appropriate for the situation. Neurologically appears grossly intact. Exhibits exquisite tenderness in the T6-T7 paraspinous region bilaterally on palpation. ASSESSMENT AND PLAN: Patient's immediate post treatment radiographs were reviewed. In view of the patient's clinical history and examination, the patient and the spouse were informed that further evaluation with an MRI of the thoracic spine was recommended to evaluate for a new fracture that may explain the patient's new symptoms and the midthoracic pain after having achieved moderate but significant relief after having had T4 and T5 levels treated with augmentation. Both the spouse and the  patient are agreeable to proceed with an MRI of the thoracic spine without infusion. In the meantime, she has  been asked to continue with conservative measures such as using a walker all the time, and to refrain from stooping, bending or lifting 10 pounds. She was also asked to refrain from driving. Questions were answered to their satisfaction. They leave with good understanding and agreement with the above management plan. Further recommendations to follow the results of the MRI of the thoracic spine. They were asked to call should they have any concerns or questions. Electronically Signed   By: Luanne Bras M.D.   On: 02/02/2017 13:33    Labs:  CBC:  Recent Labs  01/14/17 0648 02/26/17 2149 02/26/17 2203 03/01/17 0807  WBC 10.2 7.7  --  7.3  HGB 11.6* 11.4* 11.2* 11.3*  HCT 35.7* 34.6* 33.0* 34.4*  PLT 260 291  --  283    COAGS:  Recent Labs  01/14/17 0648 03/01/17 0807  INR 0.98 1.05  APTT 31 31    BMP:  Recent Labs  01/14/17 0648 02/26/17 2149 02/26/17 2203 03/01/17 0807  NA 137 141 141 140  K 4.4 3.8 3.8 4.2  CL 103 108 109 107  CO2 26 24  --  23  GLUCOSE 124* 148* 138* 120*  BUN 23* 23* 24* 18  CALCIUM 9.2 9.4  --  9.1  CREATININE 1.51* 1.29* 1.40* 1.27*  GFRNONAA 33* 40*  --  41*  GFRAA 39* 47*  --  48*    LIVER FUNCTION TESTS:  Recent Labs  02/26/17 2149  BILITOT 0.4  AST 15  ALT 13*  ALKPHOS 89  PROT 7.1  ALBUMIN 3.5    TUMOR MARKERS: No results for input(s): AFPTM, CEA, CA199, CHROMGRNA in the last 8760 hours.  Assessment and Plan: Symptomatic acute T6 compression fracture For T6 VP today Labs ok Risks and benefits discussed with the patient including, but not limited to education regarding the natural healing process of compression fractures without intervention, bleeding, infection, cement migration which may cause spinal cord damage, paralysis, pulmonary embolism or even death. All of the patient's questions were answered,  patient is agreeable to proceed. Consent signed and in chart.     Thank you for this interesting consult.  I greatly enjoyed meeting Danielle Fuller and look forward to participating in their care.  A copy of this report was sent to the requesting provider on this date.  Electronically Signed: Ascencion Dike, PA-C 03/01/2017, 9:43 AM   I spent a total of 20 minutes in face to face in clinical consultation, greater than 50% of which was counseling/coordinating care for vertebroplasty       ":

## 2017-03-01 NOTE — Procedures (Signed)
S/P T6 VP 

## 2017-03-01 NOTE — Discharge Instructions (Signed)
KYPHOPLASTY/VERTEBROPLASTY DISCHARGE INSTRUCTIONS ° °Medications: (check all that apply) ° °   Resume all home medications as before procedure. °             °  °Continue your pain medications as prescribed as needed.  Over the next 3-5 days, decrease your pain medication as tolerated.  Over the counter medications (i.e. Tylenol, ibuprofen, and aleve) may be substituted once severe/moderate pain symptoms have subsided. ° ° Wound Care: °- Bandages may be removed the day following your procedure.  You may get your incision wet once bandages are removed.  Bandaids may be used to cover the incisions until scab formation.  Topical ointments are optional. ° °- If you develop a fever greater than 101 degrees, have increased skin redness at the incision sites or pus-like oozing from incisions occurring within 1 week of the procedure, contact radiology at [phone] or [phone]. ° °- Ice pack to back for 15-20 minutes 2-3 time per day for first 2-3 days post procedure.  The ice will expedite muscle healing and help with the pain from the incisions. ° ° Activity: °- Bedrest today with limited activity for 24 hours post procedure. ° °- No driving for 48 hours. ° °- Increase your activity as tolerated after bedrest (with assistance if necessary). ° °- Refrain from any strenuous activity or heavy lifting (greater than 10 lbs.). ° ° Follow up: °- Contact radiology at [phone] or [phone] if any questions/concerns. ° °- A physician assistant from radiology will contact you in approximately 1 week. ° °- If a biopsy was performed at the time of your procedure, your referring physician should receive the results in usually 2-3 days. ° ° °1.No stooping,bending or lifting more than 10 lbs for 2 weeks. °2.Use walker to ambulate for 2 weeks. °3.RTC in 2 weeks PRN °

## 2017-03-01 NOTE — Progress Notes (Signed)
Daphane Shepherd notified client states no relief from pain and no new orders noted

## 2017-03-02 ENCOUNTER — Encounter (HOSPITAL_COMMUNITY): Payer: Self-pay | Admitting: Interventional Radiology

## 2017-03-02 ENCOUNTER — Telehealth (HOSPITAL_COMMUNITY): Payer: Self-pay

## 2017-03-02 NOTE — Telephone Encounter (Signed)
Pt or husband will call in about a week if they decide to schedule a f/u. Right now she is feeling better. AW

## 2017-03-02 NOTE — Telephone Encounter (Signed)
Called to schedule 2 wk f/u, left message for pt to return call. AW

## 2017-04-28 DIAGNOSIS — R0602 Shortness of breath: Secondary | ICD-10-CM | POA: Diagnosis not present

## 2017-05-16 DIAGNOSIS — E119 Type 2 diabetes mellitus without complications: Secondary | ICD-10-CM | POA: Diagnosis not present

## 2017-05-16 DIAGNOSIS — S2220XA Unspecified fracture of sternum, initial encounter for closed fracture: Secondary | ICD-10-CM | POA: Diagnosis not present

## 2017-05-16 DIAGNOSIS — G9050 Complex regional pain syndrome I, unspecified: Secondary | ICD-10-CM

## 2017-05-16 DIAGNOSIS — K219 Gastro-esophageal reflux disease without esophagitis: Secondary | ICD-10-CM | POA: Diagnosis not present

## 2017-05-16 DIAGNOSIS — M85862 Other specified disorders of bone density and structure, left lower leg: Secondary | ICD-10-CM | POA: Diagnosis not present

## 2017-05-16 DIAGNOSIS — N390 Urinary tract infection, site not specified: Secondary | ICD-10-CM | POA: Diagnosis not present

## 2017-05-16 DIAGNOSIS — S72422A Displaced fracture of lateral condyle of left femur, initial encounter for closed fracture: Secondary | ICD-10-CM

## 2017-05-16 DIAGNOSIS — M2500 Hemarthrosis, unspecified joint: Secondary | ICD-10-CM

## 2017-05-17 DIAGNOSIS — K219 Gastro-esophageal reflux disease without esophagitis: Secondary | ICD-10-CM | POA: Diagnosis not present

## 2017-05-17 DIAGNOSIS — S2220XA Unspecified fracture of sternum, initial encounter for closed fracture: Secondary | ICD-10-CM | POA: Diagnosis not present

## 2017-05-17 DIAGNOSIS — G9050 Complex regional pain syndrome I, unspecified: Secondary | ICD-10-CM | POA: Diagnosis not present

## 2017-05-17 DIAGNOSIS — E119 Type 2 diabetes mellitus without complications: Secondary | ICD-10-CM | POA: Diagnosis not present

## 2017-05-17 DIAGNOSIS — M85862 Other specified disorders of bone density and structure, left lower leg: Secondary | ICD-10-CM | POA: Diagnosis not present

## 2017-05-17 DIAGNOSIS — S72422A Displaced fracture of lateral condyle of left femur, initial encounter for closed fracture: Secondary | ICD-10-CM | POA: Diagnosis not present

## 2017-05-17 DIAGNOSIS — N390 Urinary tract infection, site not specified: Secondary | ICD-10-CM | POA: Diagnosis not present

## 2017-05-17 DIAGNOSIS — M2500 Hemarthrosis, unspecified joint: Secondary | ICD-10-CM | POA: Diagnosis not present

## 2017-05-18 DIAGNOSIS — S72422A Displaced fracture of lateral condyle of left femur, initial encounter for closed fracture: Secondary | ICD-10-CM | POA: Diagnosis not present

## 2017-05-18 DIAGNOSIS — N390 Urinary tract infection, site not specified: Secondary | ICD-10-CM | POA: Diagnosis not present

## 2017-05-18 DIAGNOSIS — M2500 Hemarthrosis, unspecified joint: Secondary | ICD-10-CM | POA: Diagnosis not present

## 2017-05-18 DIAGNOSIS — G9050 Complex regional pain syndrome I, unspecified: Secondary | ICD-10-CM | POA: Diagnosis not present

## 2017-05-19 DIAGNOSIS — M2500 Hemarthrosis, unspecified joint: Secondary | ICD-10-CM | POA: Diagnosis not present

## 2017-05-19 DIAGNOSIS — G9050 Complex regional pain syndrome I, unspecified: Secondary | ICD-10-CM | POA: Diagnosis not present

## 2017-05-19 DIAGNOSIS — N390 Urinary tract infection, site not specified: Secondary | ICD-10-CM | POA: Diagnosis not present

## 2017-05-19 DIAGNOSIS — S72422A Displaced fracture of lateral condyle of left femur, initial encounter for closed fracture: Secondary | ICD-10-CM | POA: Diagnosis not present

## 2017-05-21 DIAGNOSIS — G9050 Complex regional pain syndrome I, unspecified: Secondary | ICD-10-CM | POA: Diagnosis not present

## 2017-05-21 DIAGNOSIS — M2500 Hemarthrosis, unspecified joint: Secondary | ICD-10-CM | POA: Diagnosis not present

## 2017-05-21 DIAGNOSIS — S72422A Displaced fracture of lateral condyle of left femur, initial encounter for closed fracture: Secondary | ICD-10-CM | POA: Diagnosis not present

## 2017-05-21 DIAGNOSIS — N390 Urinary tract infection, site not specified: Secondary | ICD-10-CM | POA: Diagnosis not present

## 2017-05-22 DIAGNOSIS — G9050 Complex regional pain syndrome I, unspecified: Secondary | ICD-10-CM | POA: Diagnosis not present

## 2017-05-22 DIAGNOSIS — M2500 Hemarthrosis, unspecified joint: Secondary | ICD-10-CM | POA: Diagnosis not present

## 2017-05-22 DIAGNOSIS — N390 Urinary tract infection, site not specified: Secondary | ICD-10-CM | POA: Diagnosis not present

## 2017-05-22 DIAGNOSIS — S72422A Displaced fracture of lateral condyle of left femur, initial encounter for closed fracture: Secondary | ICD-10-CM | POA: Diagnosis not present

## 2017-05-23 DIAGNOSIS — M2500 Hemarthrosis, unspecified joint: Secondary | ICD-10-CM | POA: Diagnosis not present

## 2017-05-23 DIAGNOSIS — G9050 Complex regional pain syndrome I, unspecified: Secondary | ICD-10-CM | POA: Diagnosis not present

## 2017-05-23 DIAGNOSIS — S72422A Displaced fracture of lateral condyle of left femur, initial encounter for closed fracture: Secondary | ICD-10-CM | POA: Diagnosis not present

## 2017-05-23 DIAGNOSIS — N390 Urinary tract infection, site not specified: Secondary | ICD-10-CM | POA: Diagnosis not present

## 2017-06-15 ENCOUNTER — Telehealth (HOSPITAL_COMMUNITY): Payer: Self-pay

## 2017-06-15 NOTE — Telephone Encounter (Signed)
Called to see if Danielle Fuller was still having back pain. Spoke to her husband and she is not currently having any back pain. She broke her leg and is trying to deal with that at the moment. Just got out of rehab. Her husband will call back if things change as far as her back is concerned. AW

## 2017-08-17 DIAGNOSIS — J45909 Unspecified asthma, uncomplicated: Secondary | ICD-10-CM | POA: Insufficient documentation

## 2017-08-17 DIAGNOSIS — I5189 Other ill-defined heart diseases: Secondary | ICD-10-CM | POA: Insufficient documentation

## 2017-08-17 DIAGNOSIS — E119 Type 2 diabetes mellitus without complications: Secondary | ICD-10-CM | POA: Insufficient documentation

## 2017-11-02 DIAGNOSIS — I10 Essential (primary) hypertension: Secondary | ICD-10-CM | POA: Diagnosis not present

## 2017-11-02 DIAGNOSIS — N179 Acute kidney failure, unspecified: Secondary | ICD-10-CM | POA: Diagnosis not present

## 2017-11-02 DIAGNOSIS — E119 Type 2 diabetes mellitus without complications: Secondary | ICD-10-CM

## 2017-11-03 DIAGNOSIS — E119 Type 2 diabetes mellitus without complications: Secondary | ICD-10-CM | POA: Diagnosis not present

## 2017-11-03 DIAGNOSIS — N179 Acute kidney failure, unspecified: Secondary | ICD-10-CM | POA: Diagnosis not present

## 2017-11-03 DIAGNOSIS — I10 Essential (primary) hypertension: Secondary | ICD-10-CM | POA: Diagnosis not present

## 2017-11-04 DIAGNOSIS — E119 Type 2 diabetes mellitus without complications: Secondary | ICD-10-CM | POA: Diagnosis not present

## 2017-11-04 DIAGNOSIS — I10 Essential (primary) hypertension: Secondary | ICD-10-CM | POA: Diagnosis not present

## 2017-11-04 DIAGNOSIS — N179 Acute kidney failure, unspecified: Secondary | ICD-10-CM | POA: Diagnosis not present

## 2017-11-05 DIAGNOSIS — N179 Acute kidney failure, unspecified: Secondary | ICD-10-CM | POA: Diagnosis not present

## 2017-11-05 DIAGNOSIS — I10 Essential (primary) hypertension: Secondary | ICD-10-CM | POA: Diagnosis not present

## 2017-11-05 DIAGNOSIS — E119 Type 2 diabetes mellitus without complications: Secondary | ICD-10-CM | POA: Diagnosis not present

## 2018-07-22 ENCOUNTER — Other Ambulatory Visit: Payer: Self-pay

## 2018-07-22 DIAGNOSIS — I6522 Occlusion and stenosis of left carotid artery: Secondary | ICD-10-CM

## 2018-09-05 ENCOUNTER — Encounter: Payer: Medicare Other | Admitting: Surgery

## 2018-09-05 ENCOUNTER — Encounter (HOSPITAL_COMMUNITY): Payer: Medicare Other

## 2018-09-09 ENCOUNTER — Encounter (HOSPITAL_COMMUNITY): Payer: Medicare Other

## 2018-09-09 ENCOUNTER — Encounter: Payer: Medicare Other | Admitting: Vascular Surgery

## 2018-11-04 ENCOUNTER — Encounter: Payer: Medicare Other | Admitting: Vascular Surgery

## 2018-11-04 ENCOUNTER — Encounter (HOSPITAL_COMMUNITY): Payer: Medicare Other

## 2019-03-01 ENCOUNTER — Other Ambulatory Visit: Payer: Self-pay

## 2019-03-01 DIAGNOSIS — I6522 Occlusion and stenosis of left carotid artery: Secondary | ICD-10-CM

## 2019-03-10 ENCOUNTER — Ambulatory Visit (INDEPENDENT_AMBULATORY_CARE_PROVIDER_SITE_OTHER): Payer: Medicare Other | Admitting: Vascular Surgery

## 2019-03-10 ENCOUNTER — Other Ambulatory Visit: Payer: Self-pay

## 2019-03-10 ENCOUNTER — Encounter: Payer: Self-pay | Admitting: Vascular Surgery

## 2019-03-10 ENCOUNTER — Ambulatory Visit (HOSPITAL_COMMUNITY)
Admission: RE | Admit: 2019-03-10 | Discharge: 2019-03-10 | Disposition: A | Discharge status: Home / Self Care | Payer: Medicare Other | Source: Ambulatory Visit | Attending: Vascular Surgery | Admitting: Vascular Surgery

## 2019-03-10 VITALS — BP 138/75 | HR 70 | Temp 98.0°F | Resp 14 | Ht <= 58 in | Wt 104.0 lb

## 2019-03-10 DIAGNOSIS — I6522 Occlusion and stenosis of left carotid artery: Secondary | ICD-10-CM

## 2019-03-10 NOTE — Progress Notes (Signed)
Patient ID: Danielle Fuller, female   DOB: 1945/08/19, 73 y.o.   MRN: [id number]  Reason for Consult: Carotid (Pt denies any new concerns today. )   Referred by Ernestene Kiel, MD  Subjective:     HPI:  Danielle Fuller is a 73 y.o. female has a history of visual disturbance that occurred in December of last year.  She underwent duplex which demonstrated concern for left ICA stenosis at that time.  Since that time she has done well no further stroke TIA no amaurosis.  States that she does take aspirin daily and also takes WelChol as she cannot tolerate statins.  She has seen a cardiologist before secondary to abnormal echo but does not know of any specific coronary disease.  Past Medical History:  Diagnosis Date  . Diabetes mellitus without complication ([place])   . Fibromyalgia   . Neuropathy   . RSD (reflex sympathetic dystrophy)    Family History  Problem Relation Age of Onset  . Stroke Mother   . Heart disease Father    Past Surgical History:  Procedure Laterality Date  . ANKLE FRACTURE SURGERY    . CARPAL TUNNEL RELEASE    . IR RADIOLOGIST EVAL & MGMT  01/07/2017  . IR RADIOLOGIST EVAL & MGMT  02/02/2017  . IR VERTEBROPLASTY CERV/THOR BX INC UNI/BIL INC/INJECT/IMAGING  01/14/2017  . IR VERTEBROPLASTY CERV/THOR BX INC UNI/BIL INC/INJECT/IMAGING  03/01/2017  . IR VERTEBROPLASTY EA ADDL (T&LS) BX INC UNI/BIL INC INJECT/IMAGING  01/14/2017  . KNEE ARTHROSCOPY      Short Social History:  Social History   Tobacco Use  . Smoking status: Former Smoker    Types: Cigarettes  . Smokeless tobacco: Never Used  Substance Use Topics  . Alcohol use: No    No Known Allergies  Current Outpatient Medications  Medication Sig Dispense Refill  . baclofen (LIORESAL) 20 MG tablet Take 20 mg by mouth 3 (three) times daily as needed for muscle spasms.    . calcitonin, salmon, (MIACALCIN/FORTICAL) 200 UNIT/ACT nasal spray Place 1 spray into alternate nostrils daily.    . colesevelam  (WELCHOL) 625 MG tablet Take 1,875 mg by mouth 2 (two) times daily with a meal.    . Cyanocobalamin (B-12 COMPLIANCE INJECTION) 1000 MCG/ML KIT Inject 1 mL as directed every 30 (thirty) days.    . diclofenac sodium (VOLTAREN) 1 % GEL Apply 2 g topically 4 (four) times daily. 100 g 0  . dicyclomine (BENTYL) 20 MG tablet Take 20 mg by mouth 3 (three) times daily before meals.    . fluticasone (FLONASE) 50 MCG/ACT nasal spray Place 2 sprays into both nostrils daily.    . magnesium oxide (MAG-OX) 400 MG tablet Take 400 mg by mouth 2 (two) times daily.    . metFORMIN (GLUCOPHAGE-XR) 500 MG 24 hr tablet Take 500-1,000 mg by mouth See admin instructions. Take 500 mg every morning  Take 1000 mg every evening     . methadone (DOLOPHINE) 5 MG tablet Take 5 mg by mouth 5 (five) times daily.    . promethazine (PHENERGAN) 25 MG tablet Take 25 mg by mouth 3 (three) times daily as needed for nausea or vomiting.    . ranitidine (ZANTAC) 150 MG tablet Take 150 mg by mouth 2 (two) times daily.     No current facility-administered medications for this visit.     Review of Systems  Constitutional:  Constitutional negative. HENT: HENT negative.  Eyes: Positive for visual disturbance.   Respiratory:  Positive for cough, shortness of breath and wheezing.  Cardiovascular: Positive for claudication.  GI: Gastrointestinal negative.  Musculoskeletal: Musculoskeletal negative.  Skin: Skin negative.  Neurological: Neurological negative. Hematologic: Hematologic/lymphatic negative.  Psychiatric: Psychiatric negative.        Objective:  Objective   Vitals:   03/10/19 1434  BP: 138/75  Pulse: 70  Resp: 14  Temp: 98 F (36.7 C)  TempSrc: Temporal  SpO2: 97%  Weight: 104 lb (47.2 kg)  Height: 4' 9"  (1.448 m)   Body mass index is 22.51 kg/m.  Physical Exam Neck:     Musculoskeletal: Normal range of motion and neck supple.  Cardiovascular:     Rate and Rhythm: Normal rate and regular rhythm.      Pulses:          Radial pulses are 2+ on the right side and 2+ on the left side.       Femoral pulses are 2+ on the right side and 2+ on the left side.    Comments: Bilateral carotid bruits Abdominal:     Palpations: Abdomen is soft.  Musculoskeletal: Normal range of motion.        General: No swelling.  Skin:    General: Skin is warm and dry.  Neurological:     General: No focal deficit present.     Mental Status: She is alert and oriented to person, place, and time.  Psychiatric:        Mood and Affect: Mood normal.        Behavior: Behavior normal.        Thought Content: Thought content normal.        Judgment: Judgment normal.     Data: I have independently interpreted her bilateral carotid duplex which demonstrates 40 to 59% stenosis on the right peak systolic velocity 157 in the left is 80 to 99% stenosis and heterogeneous with peak systolic velocity 262 and end-diastolic velocity 035.     Assessment/Plan:     73 year old female presents with 80-99% stenosis left ICA with peak systolic velocity 597 and end-diastolic 416.  Has a history of visual disturbance on the left concerning for stroke but no recent TIA amaurosis or stroke.  I discussed with her the options to reduce her risk of stroke in the future being carotid endarterectomy or stenting.  We will proceed with possible stenting from a trans-carotid approach will get CT angios to evaluate have her follow-up in approximately 4 weeks.  She will need to be on Plavix if we are to proceed with stenting.  We will also get her to see her previous cardiologist for clearance in [place].     Waynetta Sandy MD [facility]

## 2019-03-13 ENCOUNTER — Encounter: Payer: Self-pay | Admitting: *Deleted

## 2019-03-15 ENCOUNTER — Other Ambulatory Visit: Payer: Self-pay | Admitting: Vascular Surgery

## 2019-03-15 DIAGNOSIS — I6522 Occlusion and stenosis of left carotid artery: Secondary | ICD-10-CM

## 2019-03-31 ENCOUNTER — Other Ambulatory Visit: Payer: Medicare Other

## 2019-04-04 ENCOUNTER — Other Ambulatory Visit: Payer: Medicare Other

## 2019-04-06 ENCOUNTER — Other Ambulatory Visit: Payer: Self-pay | Admitting: Vascular Surgery

## 2019-04-06 ENCOUNTER — Ambulatory Visit
Admission: RE | Admit: 2019-04-06 | Discharge: 2019-04-06 | Disposition: A | Discharge status: Home / Self Care | Payer: Medicare Other | Source: Ambulatory Visit | Attending: Vascular Surgery | Admitting: Vascular Surgery

## 2019-04-06 ENCOUNTER — Other Ambulatory Visit: Payer: Self-pay

## 2019-04-06 ENCOUNTER — Telehealth: Payer: Self-pay | Admitting: *Deleted

## 2019-04-06 DIAGNOSIS — I6522 Occlusion and stenosis of left carotid artery: Secondary | ICD-10-CM

## 2019-04-06 MED ORDER — IOPAMIDOL (ISOVUE-370) INJECTION 76%: 75.0000 mL | Freq: Once | INTRAVENOUS | Status: DC | PRN

## 2019-04-06 NOTE — Progress Notes (Signed)
Pt at [facility] today for CTA- However Creat is 1.7, GFR is 29 per Dr. Armandina Gemma no IV contrast- recommends MRA without contrast be scheduled instead. Alternatively the patient could attempt hydrating with fluid and having labwork rechecked prior to rescheduling for CTA once GFR is greater than 30- spoke with Danielle Fuller at office and relayed this information

## 2019-04-06 NOTE — Telephone Encounter (Signed)
Avril at [facility] imaging called to report that patient's labs were abnormal; creatinine 1.7 and GFR 29, they do not use contrast if GFR is <30. I paged Dr. Donzetta Matters and he said to just do CT Head and Neck without contrast for preop planning.

## 2019-04-06 NOTE — Progress Notes (Signed)
Zigmund Daniel called back and we are going to proceed with CT head, neck without contrast for Dr. Donzetta Matters

## 2019-04-07 ENCOUNTER — Other Ambulatory Visit: Payer: Self-pay

## 2019-04-07 ENCOUNTER — Other Ambulatory Visit: Payer: Self-pay | Admitting: *Deleted

## 2019-04-07 ENCOUNTER — Encounter: Payer: Self-pay | Admitting: Vascular Surgery

## 2019-04-07 ENCOUNTER — Ambulatory Visit (INDEPENDENT_AMBULATORY_CARE_PROVIDER_SITE_OTHER): Payer: Medicare Other | Admitting: Vascular Surgery

## 2019-04-07 VITALS — Ht <= 58 in | Wt 104.0 lb

## 2019-04-07 DIAGNOSIS — I6522 Occlusion and stenosis of left carotid artery: Secondary | ICD-10-CM | POA: Diagnosis not present

## 2019-04-07 NOTE — Progress Notes (Signed)
Virtual Visit via Telephone Note  I connected with [name] on 04/07/2019 using the [email or web address] by telephone and verified that I was speaking with the correct person using two identifiers.    The limitations of evaluation and management by telemedicine and the availability of in person appointments have been previously discussed with the patient and are documented in the patients chart. The patient expressed understanding and consented to proceed.  PCP: Ernestene Kiel, MD  History of Present Illness: Danielle Fuller is a 73 y.o. female with history of high-grade left carotid stenosis.  She has no recent symptoms.  She underwent CT scan yesterday to evaluate for transcranial artery stent but could not get contrast.  Denies any complaints today.  She is taking aspirin daily.  Past Medical History:  Diagnosis Date  . Diabetes mellitus without complication ([place])   . Fibromyalgia   . Neuropathy   . RSD (reflex sympathetic dystrophy)   [id number]  Past Surgical History:  Procedure Laterality Date  . ANKLE FRACTURE SURGERY    . CARPAL TUNNEL RELEASE    . IR RADIOLOGIST EVAL & MGMT  01/07/2017  . IR RADIOLOGIST EVAL & MGMT  02/02/2017  . IR VERTEBROPLASTY CERV/THOR BX INC UNI/BIL INC/INJECT/IMAGING  01/14/2017  . IR VERTEBROPLASTY CERV/THOR BX INC UNI/BIL INC/INJECT/IMAGING  03/01/2017  . IR VERTEBROPLASTY EA ADDL (T&LS) BX INC UNI/BIL INC INJECT/IMAGING  01/14/2017  . KNEE ARTHROSCOPY      Current Meds  Medication Sig  . baclofen (LIORESAL) 20 MG tablet Take 20 mg by mouth 3 (three) times daily as needed for muscle spasms.  . calcitonin, salmon, (MIACALCIN/FORTICAL) 200 UNIT/ACT nasal spray Place 1 spray into alternate nostrils daily.  . colesevelam (WELCHOL) 625 MG tablet Take 1,875 mg by mouth 2 (two) times daily with a meal.  . Cyanocobalamin (B-12 COMPLIANCE INJECTION) 1000 MCG/ML KIT Inject 1 mL as directed every 30 (thirty) days.  . diclofenac sodium (VOLTAREN) 1 % GEL Apply 2 g  topically 4 (four) times daily.  Marland Kitchen dicyclomine (BENTYL) 20 MG tablet Take 20 mg by mouth 3 (three) times daily before meals.  . fluticasone (FLONASE) 50 MCG/ACT nasal spray Place 2 sprays into both nostrils daily.  . magnesium oxide (MAG-OX) 400 MG tablet Take 400 mg by mouth 2 (two) times daily.  . metFORMIN (GLUCOPHAGE-XR) 500 MG 24 hr tablet Take 500-1,000 mg by mouth See admin instructions. Take 500 mg every morning  Take 1000 mg every evening   . methadone (DOLOPHINE) 5 MG tablet Take 5 mg by mouth 5 (five) times daily.  . promethazine (PHENERGAN) 25 MG tablet Take 25 mg by mouth 3 (three) times daily as needed for nausea or vomiting.  . ranitidine (ZANTAC) 150 MG tablet Take 150 mg by mouth 2 (two) times daily.    12 system ROS was negative unless otherwise noted in HPI  Observations/Objective: No issues today, demonstrates good understanding  Assessment and Plan:  73 year old female with asymptomatic left carotid stenosis which is high-grade.  She is undergone CT scan as well as duplex.  We will plan for left carotid endarterectomy.  I discussed risk benefits alternatives.  She will get cardiac clearance next week in [place].  We will schedule her after this.    I discussed the assessment and treatment plan with the patient. The patient was provided an opportunity to ask questions and all were answered. The patient agreed with the plan and demonstrated an understanding of the instructions.   The patient was  advised to call back or seek an in-person evaluation if the symptoms worsen or if the condition fails to improve as anticipated.  I spent 8 minutes with the patient via telephone encounter.   Signed, Servando Snare, MD [facility]: [phone]  04/07/2019, 9:34 AM

## 2019-04-10 ENCOUNTER — Encounter: Payer: Self-pay | Admitting: *Deleted

## 2019-04-20 NOTE — Pre-Procedure Instructions (Signed)
[id number] [address], [place] [address] [place] [address] Phone: [phone] Fax: [place], [place] [address] Phone: [phone] Fax: [phone]      Your procedure is scheduled on Tuesday, October 6th.  Report to [facility] Main Entrance "A" at 5:30 A.M., and check in at the Admitting office.  Call this number if you have problems the morning of surgery:  [phone]  Call [phone] if you have any questions prior to your surgery date Monday-Friday 8am-4pm    Remember:  Do not eat or drink after midnight the night before your surgery   Take these medicines the morning of surgery with A SIP OF WATER ADVAIR DISKUS dicyclomine (BENTYL) fluticasone (FLONASE)  hydrALAZINE (APRESOLINE)  As needed: promethazine (PHENERGAN), pramipexole (MIRAPEX), baclofen (LIORESAL), dicyclomine (BENTYL).   As of today, STOP taking any Aleve, Naproxen, Ibuprofen, Motrin, Advil, Goody's, BC's, all herbal medications, fish oil, and all vitamins. Including: diclofenac sodium (VOLTAREN) gel.     WHAT DO I DO ABOUT MY DIABETES MEDICATION?   Marland Kitchen Do not take oral diabetes medicines (pills) the morning of surgery. metFORMIN (GLUCOPHAGE-XR)   How to Manage Your Diabetes Before and After Surgery  Why is it important to control my blood sugar before and after surgery? . Improving blood sugar levels before and after surgery helps healing and can limit problems. . A way of improving blood sugar control is eating a healthy diet by: o  Eating less sugar and carbohydrates o  Increasing activity/exercise o  Talking with your doctor about reaching your blood sugar goals . High blood sugars (greater than 180 mg/dL) can raise your risk of infections and slow your recovery, so you will need to focus on controlling your diabetes during the weeks before surgery. . Make sure  that the doctor who takes care of your diabetes knows about your planned surgery including the date and location.  How do I manage my blood sugar before surgery? . Check your blood sugar at least 4 times a day, starting 2 days before surgery, to make sure that the level is not too high or low. o Check your blood sugar the morning of your surgery when you wake up and every 2 hours until you get to the Short Stay unit. . If your blood sugar is less than 70 mg/dL, you will need to treat for low blood sugar: o Do not take insulin. o Treat a low blood sugar (less than 70 mg/dL) with  cup of clear juice (cranberry or apple), 4 glucose tablets, OR glucose gel. o Recheck blood sugar in 15 minutes after treatment (to make sure it is greater than 70 mg/dL). If your blood sugar is not greater than 70 mg/dL on recheck, call [phone] for further instructions. . Report your blood sugar to the short stay nurse when you get to Short Stay.  . If you are admitted to the hospital after surgery: o Your blood sugar will be checked by the staff and you will probably be given insulin after surgery (instead of oral diabetes medicines) to make sure you have good blood sugar levels. o The goal for blood sugar control after surgery is 80-180 mg/dL.    The Morning of Surgery  Do not wear jewelry, make-up or nail polish.  Do not wear lotions, powders, or perfumes, or deodorant  Do  not shave 48 hours prior to surgery.   Do not bring valuables to the hospital.  [facility] is not responsible for any belongings or valuables.  If you are a smoker, DO NOT Smoke 24 hours prior to surgery IF you wear a CPAP at night please bring your mask, tubing, and machine the morning of surgery   Remember that you must have someone to transport you home after your surgery, and remain with you for 24 hours if you are discharged the same day.   Contacts, glasses, hearing aids, dentures or bridgework may not be worn into surgery.     Leave your suitcase in the car.  After surgery it may be brought to your room.  For patients admitted to the hospital, discharge time will be determined by your treatment team.  Patients discharged the day of surgery will not be allowed to drive home.    Special instructions:   [facility]- Preparing For Surgery  Before surgery, you can play an important role. Because skin is not sterile, your skin needs to be as free of germs as possible. You can reduce the number of germs on your skin by washing with CHG (chlorahexidine gluconate) Soap before surgery.  CHG is an antiseptic cleaner which kills germs and bonds with the skin to continue killing germs even after washing.    Oral Hygiene is also important to reduce your risk of infection.  Remember - BRUSH YOUR TEETH THE MORNING OF SURGERY WITH YOUR REGULAR TOOTHPASTE  Please do not use if you have an allergy to CHG or antibacterial soaps. If your skin becomes reddened/irritated stop using the CHG.  Do not shave (including legs and underarms) for at least 48 hours prior to first CHG shower. It is OK to shave your face.  Please follow these instructions carefully.   1. Shower the NIGHT BEFORE SURGERY and the MORNING OF SURGERY with CHG Soap.   2. If you chose to wash your hair, wash your hair first as usual with your normal shampoo.  3. After you shampoo, rinse your hair and body thoroughly to remove the shampoo.  4. Use CHG as you would any other liquid soap. You can apply CHG directly to the skin and wash gently with a scrungie or a clean washcloth.   5. Apply the CHG Soap to your body ONLY FROM THE NECK DOWN.  Do not use on open wounds or open sores. Avoid contact with your eyes, ears, mouth and genitals (private parts). Wash Face and genitals (private parts)  with your normal soap.   6. Wash thoroughly, paying special attention to the area where your surgery will be performed.  7. Thoroughly rinse your body with warm water from  the neck down.  8. DO NOT shower/wash with your normal soap after using and rinsing off the CHG Soap.  9. Pat yourself dry with a CLEAN TOWEL.  10. Wear CLEAN PAJAMAS to bed the night before surgery, wear comfortable clothes the morning of surgery  11. Place CLEAN SHEETS on your bed the night of your first shower and DO NOT SLEEP WITH PETS.    Day of Surgery:  Do not apply any deodorants/lotions. Please shower the morning of surgery with the CHG soap  Please wear clean clothes to the hospital/surgery center.   Remember to brush your teeth WITH YOUR REGULAR TOOTHPASTE.   Please read over the following fact sheets that you were given.

## 2019-04-21 ENCOUNTER — Inpatient Hospital Stay (HOSPITAL_COMMUNITY)
Admission: RE | Admit: 2019-04-21 | Discharge: 2019-04-21 | Disposition: A | Discharge status: Home / Self Care | Payer: Medicare Other | Source: Ambulatory Visit

## 2019-04-21 ENCOUNTER — Inpatient Hospital Stay (HOSPITAL_COMMUNITY): Admission: RE | Admit: 2019-04-21 | Payer: Medicare Other | Source: Ambulatory Visit

## 2019-04-21 ENCOUNTER — Other Ambulatory Visit: Payer: Self-pay | Admitting: *Deleted

## 2019-04-27 HISTORY — PX: CARDIAC CATHETERIZATION: SHX172

## 2019-05-08 ENCOUNTER — Other Ambulatory Visit: Payer: Self-pay

## 2019-05-08 ENCOUNTER — Encounter (HOSPITAL_COMMUNITY): Payer: Self-pay

## 2019-05-08 ENCOUNTER — Encounter (HOSPITAL_COMMUNITY)
Admission: RE | Admit: 2019-05-08 | Discharge: 2019-05-08 | Disposition: A | Discharge status: Home / Self Care | Payer: Medicare Other | Source: Ambulatory Visit | Attending: Vascular Surgery | Admitting: Vascular Surgery

## 2019-05-08 ENCOUNTER — Other Ambulatory Visit (HOSPITAL_COMMUNITY)
Admission: RE | Admit: 2019-05-08 | Discharge: 2019-05-08 | Disposition: A | Discharge status: Home / Self Care | Payer: Medicare Other | Source: Ambulatory Visit | Attending: Vascular Surgery | Admitting: Vascular Surgery

## 2019-05-08 DIAGNOSIS — E119 Type 2 diabetes mellitus without complications: Secondary | ICD-10-CM | POA: Insufficient documentation

## 2019-05-08 DIAGNOSIS — Z87891 Personal history of nicotine dependence: Secondary | ICD-10-CM | POA: Insufficient documentation

## 2019-05-08 DIAGNOSIS — M797 Fibromyalgia: Secondary | ICD-10-CM | POA: Insufficient documentation

## 2019-05-08 DIAGNOSIS — Z7984 Long term (current) use of oral hypoglycemic drugs: Secondary | ICD-10-CM | POA: Insufficient documentation

## 2019-05-08 DIAGNOSIS — Z79899 Other long term (current) drug therapy: Secondary | ICD-10-CM | POA: Insufficient documentation

## 2019-05-08 DIAGNOSIS — Z01818 Encounter for other preprocedural examination: Secondary | ICD-10-CM | POA: Insufficient documentation

## 2019-05-08 HISTORY — DX: Other ill-defined heart diseases: I51.89

## 2019-05-08 HISTORY — DX: Mixed hyperlipidemia: E78.2

## 2019-05-08 HISTORY — DX: Other forms of dyspnea: R06.09

## 2019-05-08 HISTORY — DX: Sleep apnea, unspecified: G47.30

## 2019-05-08 HISTORY — DX: Cardiac murmur, unspecified: R01.1

## 2019-05-08 HISTORY — DX: Essential (primary) hypertension: I10

## 2019-05-08 HISTORY — DX: Anemia, unspecified: D64.9

## 2019-05-08 HISTORY — DX: Gastro-esophageal reflux disease without esophagitis: K21.9

## 2019-05-08 HISTORY — DX: Dyspnea, unspecified: R06.00

## 2019-05-08 HISTORY — DX: Unspecified asthma, uncomplicated: J45.909

## 2019-05-08 LAB — COMPREHENSIVE METABOLIC PANEL
ALT: 11 U/L (ref 0–44)
AST: 18 U/L (ref 15–41)
Albumin: 3.9 g/dL (ref 3.5–5.0)
Alkaline Phosphatase: 68 U/L (ref 38–126)
Anion gap: 9 (ref 5–15)
BUN: 24 mg/dL — ABNORMAL HIGH (ref 8–23)
CO2: 24 mmol/L (ref 22–32)
Calcium: 9.1 mg/dL (ref 8.9–10.3)
Chloride: 104 mmol/L (ref 98–111)
Creatinine, Ser: 1.5 mg/dL — ABNORMAL HIGH (ref 0.44–1.00)
GFR calc Af Amer: 39 mL/min — ABNORMAL LOW (ref >60)
GFR calc non Af Amer: 34 mL/min — ABNORMAL LOW (ref >60)
Glucose, Bld: 121 mg/dL — ABNORMAL HIGH (ref 70–99)
Potassium: 4.9 mmol/L (ref 3.5–5.1)
Sodium: 137 mmol/L (ref 135–145)
Total Bilirubin: 0.4 mg/dL (ref 0.3–1.2)
Total Protein: 6.9 g/dL (ref 6.5–8.1)

## 2019-05-08 LAB — BLOOD GAS, ARTERIAL
Acid-Base Excess: 2.1 mmol/L — ABNORMAL HIGH (ref 0.0–2.0)
Bicarbonate: 26.9 mmol/L (ref 20.0–28.0)
Drawn by: 42180
FIO2: 21
O2 Saturation: 95.8 %
Patient temperature: 98.6
pCO2 arterial: 47.8 mmHg (ref 32.0–48.0)
pH, Arterial: 7.369 (ref 7.350–7.450)
pO2, Arterial: 76 mmHg — ABNORMAL LOW (ref 83.0–108.0)

## 2019-05-08 LAB — APTT: aPTT: 32 seconds (ref 24–36)

## 2019-05-08 LAB — URINALYSIS, ROUTINE W REFLEX MICROSCOPIC
Bilirubin Urine: NEGATIVE
Glucose, UA: NEGATIVE mg/dL
Hgb urine dipstick: NEGATIVE
Ketones, ur: NEGATIVE mg/dL
Leukocytes,Ua: NEGATIVE
Nitrite: NEGATIVE
Protein, ur: NEGATIVE mg/dL
Specific Gravity, Urine: 1.011 (ref 1.005–1.030)
pH: 5 (ref 5.0–8.0)

## 2019-05-08 LAB — CBC
HCT: 34.1 % — ABNORMAL LOW (ref 36.0–46.0)
Hemoglobin: 11.1 g/dL — ABNORMAL LOW (ref 12.0–15.0)
MCH: 31.7 pg (ref 26.0–34.0)
MCHC: 32.6 g/dL (ref 30.0–36.0)
MCV: 97.4 fL (ref 80.0–100.0)
Platelets: 187 10*3/uL (ref 150–400)
RBC: 3.5 MIL/uL — ABNORMAL LOW (ref 3.87–5.11)
RDW: 13.2 % (ref 11.5–15.5)
WBC: 6.8 10*3/uL (ref 4.0–10.5)
nRBC: 0.3 % — ABNORMAL HIGH (ref 0.0–0.2)

## 2019-05-08 LAB — PROTIME-INR
INR: 1 (ref 0.8–1.2)
Prothrombin Time: 13.3 seconds (ref 11.4–15.2)

## 2019-05-08 LAB — HEMOGLOBIN A1C
Hgb A1c MFr Bld: 6.7 % — ABNORMAL HIGH (ref 4.8–5.6)
Mean Plasma Glucose: 145.59 mg/dL

## 2019-05-08 LAB — TYPE AND SCREEN
ABO/RH(D): O POS
Antibody Screen: NEGATIVE

## 2019-05-08 LAB — SURGICAL PCR SCREEN
MRSA, PCR: POSITIVE — AB
Staphylococcus aureus: POSITIVE — AB

## 2019-05-08 LAB — GLUCOSE, CAPILLARY: Glucose-Capillary: 153 mg/dL — ABNORMAL HIGH (ref 70–99)

## 2019-05-08 LAB — ABO/RH: ABO/RH(D): O POS

## 2019-05-08 NOTE — Progress Notes (Signed)
PCP: Dr. Ernestene Kiel Cardiologist: Dr. Beatrix Fetters [place]--last OV note in Care Everywhere  EKG: Today CXR: 04/27/2019- C.E.--No acute cardiopulmonary disease ECHO: 04/11/2019 C.E. Stress Test: 04/11/2019 C.E. Cardiac Cath: 04/27/2019 C.E. Sleep Study: 20 years ago--has OSA but does not use CPAP  Fasting Blood Sugar- 120s Checks Blood Sugar daily in the evenings, 4-5 hours after eating  Takes 5mg  Methadone five times a day for RSD--started by [facility] in the 1990s.  Called [facility], okay for pt to continue.  Patient denies shortness of breath, fever, cough, and chest pain at PAT appointment.  Patient verbalized understanding of instructions provided today at the PAT appointment.  Patient asked to review instructions at home and day of surgery.

## 2019-05-08 NOTE — Progress Notes (Signed)
[id number] Tia Alert, [address] [place] [address] Phone: [phone] Fax: [place], [place] [address] Phone: [phone] Fax: [phone]      Your procedure is scheduled on May 11, 2019.  Report to [facility] Main Entrance "A" at 08:00 A.M., and check in at the Admitting office.  Call this number if you have problems the morning of surgery:  [phone]  Call [phone] if you have any questions prior to your surgery date Monday-Friday 8am-4pm    Remember:  Do not eat or drink after midnight the night before your surgery    Take these medicines the morning of surgery with A SIP OF WATER : Advair Diskus Baclofen (Lioresal) if needed Fluticasone (Flonase) nasal spray Hydralazine (Apresoline) Methadone (Dolophine) Pramipexole (Mirapex) if needed Promethazine (Phenergan) if needed  7 days prior to surgery STOP taking any Aspirin (unless otherwise instructed by your surgeon), Aleve, Naproxen, Ibuprofen, Motrin, Advil, Goody's, BC's, all herbal medications, fish oil, and all vitamins.   WHAT DO I DO ABOUT MY DIABETES MEDICATION?   Marland Kitchen Do not take oral diabetes medicines (pills) the morning of surgery. DO NOT TAKE METFORMIN (Glucophage-XR) the morning of surgery.   How to Manage Your Diabetes Before and After Surgery  Why is it important to control my blood sugar before and after surgery? . Improving blood sugar levels before and after surgery helps healing and can limit problems. . A way of improving blood sugar control is eating a healthy diet by: o  Eating less sugar and carbohydrates o  Increasing activity/exercise o  Talking with your doctor about reaching your blood sugar goals . High blood sugars (greater than 180 mg/dL) can raise your risk of infections and slow your recovery, so you will need to focus on controlling  your diabetes during the weeks before surgery. . Make sure that the doctor who takes care of your diabetes knows about your planned surgery including the date and location.  How do I manage my blood sugar before surgery? . Check your blood sugar at least 4 times a day, starting 2 days before surgery, to make sure that the level is not too high or low. o Check your blood sugar the morning of your surgery when you wake up and every 2 hours until you get to the Short Stay unit. . If your blood sugar is less than 70 mg/dL, you will need to treat for low blood sugar: o Do not take insulin. o Treat a low blood sugar (less than 70 mg/dL) with  cup of clear juice (cranberry or apple), 4 glucose tablets, OR glucose gel. o Recheck blood sugar in 15 minutes after treatment (to make sure it is greater than 70 mg/dL). If your blood sugar is not greater than 70 mg/dL on recheck, call [phone] for further instructions. . Report your blood sugar to the short stay nurse when you get to Short Stay.  . If you are admitted to the hospital after surgery: o Your blood sugar will be checked by the staff and you will probably be given insulin after surgery (instead of oral diabetes medicines) to make sure you have good blood sugar levels. o The goal for blood sugar control after surgery is 80-180 mg/dL.    The Morning of Surgery  Do not wear jewelry, make-up or nail polish.  Do not wear lotions, powders, or perfumes/colognes, or deodorant  Do not shave 48 hours prior to surgery.   Do not bring valuables to the hospital.  [facility] is not responsible for any belongings or valuables.  If you are a smoker, DO NOT Smoke 24 hours prior to surgery IF you wear a CPAP at night please bring your mask, tubing, and machine the morning of surgery   Remember that you must have someone to transport you home after your surgery, and remain with you for 24 hours if you are discharged the same day.  Contacts, glasses,  hearing aids, dentures or bridgework may not be worn into surgery.   Leave your suitcase in the car.  After surgery it may be brought to your room.  For patients admitted to the hospital, discharge time will be determined by your treatment team.  Patients discharged the day of surgery will not be allowed to drive home.    Special instructions:   [facility]- Preparing For Surgery  Before surgery, you can play an important role. Because skin is not sterile, your skin needs to be as free of germs as possible. You can reduce the number of germs on your skin by washing with CHG (chlorahexidine gluconate) Soap before surgery.  CHG is an antiseptic cleaner which kills germs and bonds with the skin to continue killing germs even after washing.    Oral Hygiene is also important to reduce your risk of infection.  Remember - BRUSH YOUR TEETH THE MORNING OF SURGERY WITH YOUR REGULAR TOOTHPASTE  Please do not use if you have an allergy to CHG or antibacterial soaps. If your skin becomes reddened/irritated stop using the CHG.  Do not shave (including legs and underarms) for at least 48 hours prior to first CHG shower. It is OK to shave your face.  Please follow these instructions carefully.   1. Shower the NIGHT BEFORE SURGERY and the MORNING OF SURGERY with CHG Soap.   2. If you chose to wash your hair, wash your hair first as usual with your normal shampoo.  3. After you shampoo, rinse your hair and body thoroughly to remove the shampoo.  4. Use CHG as you would any other liquid soap. You can apply CHG directly to the skin and wash gently with a scrungie or a clean washcloth.   5. Apply the CHG Soap to your body ONLY FROM THE NECK DOWN.  Do not use on open wounds or open sores. Avoid contact with your eyes, ears, mouth and genitals (private parts). Wash Face and genitals (private parts)  with your normal soap.   6. Wash thoroughly, paying special attention to the area where your surgery will be  performed.  7. Thoroughly rinse your body with warm water from the neck down.  8. DO NOT shower/wash with your normal soap after using and rinsing off the CHG Soap.  9. Pat yourself dry with a CLEAN TOWEL.  10. Wear CLEAN PAJAMAS to bed the night before surgery, wear comfortable clothes the morning of surgery  11. Place CLEAN SHEETS on your bed the night of your first shower and DO NOT SLEEP WITH PETS.    Day of Surgery:  Do not apply any deodorants/lotions. Please shower the morning of surgery with the CHG soap  Please wear clean clothes to the hospital/surgery center.   Remember to brush your teeth WITH YOUR REGULAR TOOTHPASTE.   Please read over the following fact sheets that you were given.

## 2019-05-09 LAB — NOVEL CORONAVIRUS, NAA (HOSP ORDER, SEND-OUT TO REF LAB; TAT 18-24 HRS): SARS-CoV-2, NAA: NOT DETECTED

## 2019-05-09 NOTE — Progress Notes (Signed)
Anesthesia Chart Review: Follows with cardiology at [facility] for hx of diastolic dysfunction. She was seen 03/24/19 for clearance and a stress test was ordered which was abnormal. She ultimately underwent LHC 04/27/19 that showed mild nonobstructive CAD and she was cleared to proceed CEA.  Preop labs with mildly elevated creatinine at 1.50. Review of previous labs shows mild renal insufficiency with baseline creatinine ~ 1.50.  Cath 04/27/19 (care everywhere): Diagnostic Procedure Summary: Mild nonobstructive CAD Known normal LV function  Diagnostic Procedure Recommendations: no further cardiac assessment low risk for noncardiac surgery  TTe 04/11/19 (care everywhere): Summary There is no prior echocardiogram for comparison. Mild mitral annular calcification. Mild mitral regurgitation. Tricuspid valve is structurally normal. Mild tricuspid regurgitation. RVSP 42 mm Hg. Severely dilated left atrium. Left atrial volume index of 55.3 ml per meters squared BSA. Normal left ventricular systolic function Ejection fraction is visually estimated at 0000000 Diastolic function Appears normal Borderline dilated right ventricle. Right ventricular systolic pressure of 42 mm Hg consistent with moderate pulmonary hypertension.   Danielle Fuller [facility] Short Stay Center/Anesthesiology Phone [phone] 05/09/2019 12:35 PM

## 2019-05-09 NOTE — Anesthesia Preprocedure Evaluation (Addendum)
Anesthesia Evaluation  Patient identified by MRN, date of birth, ID band Patient awake    Reviewed: Allergy & Precautions, NPO status , Patient's Chart, lab work & pertinent test results  Airway Mallampati: III  TM Distance: >3 FB Neck ROM: Full    Dental  (+) Edentulous Upper, Missing   Pulmonary asthma , sleep apnea , former smoker,    Pulmonary exam normal breath sounds clear to auscultation       Cardiovascular hypertension, Pt. on medications Normal cardiovascular exam Rhythm:Regular Rate:Normal  ECG: NSR, rate 74   Neuro/Psych  Neuromuscular disease negative psych ROS   GI/Hepatic negative GI ROS, (+)     substance abuse  ,   Endo/Other  diabetes, Oral Hypoglycemic Agents  Renal/GU negative Renal ROS     Musculoskeletal  (+) Fibromyalgia -, narcotic dependentRSD (reflex sympathetic dystrophy)   Abdominal   Peds  Hematology  (+) anemia , HLD   Anesthesia Other Findings left carotid stenosis  Reproductive/Obstetrics                           Anesthesia Physical Anesthesia Plan  ASA: III  Anesthesia Plan: General   Post-op Pain Management:    Induction: Intravenous  PONV Risk Score and Plan: 3 and Ondansetron, Dexamethasone and Treatment may vary due to age or medical condition  Airway Management Planned: Oral ETT  Additional Equipment: Arterial line  Intra-op Plan:   Post-operative Plan: Extubation in OR  Informed Consent: I have reviewed the patients History and Physical, chart, labs and discussed the procedure including the risks, benefits and alternatives for the proposed anesthesia with the patient or authorized representative who has indicated his/her understanding and acceptance.     Dental advisory given  Plan Discussed with: CRNA  Anesthesia Plan Comments: (Per PA-C: Follows with cardiology at [facility] for hx of diastolic dysfunction. She was seen 03/24/19 for  clearance and a stress test was ordered which was abnormal. She ultimately underwent LHC 04/27/19 that showed mild nonobstructive CAD and she was cleared to proceed CEA.  Preop labs with mildly elevated creatinine at 1.50. Review of previous labs shows mild renal insufficiency with baseline creatinine ~ 1.50.  Cath 04/27/19 (care everywhere): Diagnostic Procedure Summary: Mild nonobstructive CAD Known normal LV function  Diagnostic Procedure Recommendations: no further cardiac assessment low risk for noncardiac surgery  TTe 04/11/19 (care everywhere): Summary There is no prior echocardiogram for comparison. Mild mitral annular calcification. Mild mitral regurgitation. Tricuspid valve is structurally normal. Mild tricuspid regurgitation. RVSP 42 mm Hg. Severely dilated left atrium. Left atrial volume index of 55.3 ml per meters squared BSA. Normal left ventricular systolic function Ejection fraction is visually estimated at 0000000 Diastolic function Appears normal Borderline dilated right ventricle. Right ventricular systolic pressure of 42 mm Hg consistent with moderate pulmonary hypertension.  )      Anesthesia Quick Evaluation

## 2019-05-10 MED ORDER — CEFAZOLIN SODIUM-DEXTROSE 2-4 GM/100ML-% IV SOLN
2.0000 g | INTRAVENOUS | Status: DC
  Filled 2019-05-10: qty 100

## 2019-05-10 MED ORDER — VANCOMYCIN HCL IN DEXTROSE 1-5 GM/200ML-% IV SOLN
1000.0000 mg | INTRAVENOUS | Status: AC
  Administered 2019-05-11: 1000 mg via INTRAVENOUS
  Filled 2019-05-10: qty 200

## 2019-05-11 ENCOUNTER — Inpatient Hospital Stay (HOSPITAL_COMMUNITY)
Admission: RE | Admit: 2019-05-11 | Discharge: 2019-05-12 | DRG: 038 | Disposition: A | Discharge status: Home / Self Care | Payer: Medicare Other | Attending: Vascular Surgery | Admitting: Vascular Surgery

## 2019-05-11 ENCOUNTER — Encounter (HOSPITAL_COMMUNITY): Payer: Self-pay

## 2019-05-11 ENCOUNTER — Other Ambulatory Visit: Payer: Self-pay

## 2019-05-11 ENCOUNTER — Inpatient Hospital Stay (HOSPITAL_COMMUNITY): Payer: Medicare Other | Admitting: Vascular Surgery

## 2019-05-11 ENCOUNTER — Encounter (HOSPITAL_COMMUNITY)
Admission: RE | Disposition: A | Discharge status: Home / Self Care | Payer: Self-pay | Source: Home / Self Care | Attending: Vascular Surgery

## 2019-05-11 ENCOUNTER — Inpatient Hospital Stay (HOSPITAL_COMMUNITY): Payer: Medicare Other | Admitting: Certified Registered"

## 2019-05-11 DIAGNOSIS — Z823 Family history of stroke: Secondary | ICD-10-CM

## 2019-05-11 DIAGNOSIS — J449 Chronic obstructive pulmonary disease, unspecified: Secondary | ICD-10-CM | POA: Diagnosis present

## 2019-05-11 DIAGNOSIS — Z8249 Family history of ischemic heart disease and other diseases of the circulatory system: Secondary | ICD-10-CM

## 2019-05-11 DIAGNOSIS — G4730 Sleep apnea, unspecified: Secondary | ICD-10-CM | POA: Diagnosis present

## 2019-05-11 DIAGNOSIS — E1122 Type 2 diabetes mellitus with diabetic chronic kidney disease: Secondary | ICD-10-CM | POA: Diagnosis present

## 2019-05-11 DIAGNOSIS — K219 Gastro-esophageal reflux disease without esophagitis: Secondary | ICD-10-CM | POA: Diagnosis present

## 2019-05-11 DIAGNOSIS — Z20828 Contact with and (suspected) exposure to other viral communicable diseases: Secondary | ICD-10-CM | POA: Diagnosis present

## 2019-05-11 DIAGNOSIS — N9989 Other postprocedural complications and disorders of genitourinary system: Secondary | ICD-10-CM | POA: Diagnosis not present

## 2019-05-11 DIAGNOSIS — G9050 Complex regional pain syndrome I, unspecified: Secondary | ICD-10-CM | POA: Diagnosis present

## 2019-05-11 DIAGNOSIS — E875 Hyperkalemia: Secondary | ICD-10-CM | POA: Diagnosis not present

## 2019-05-11 DIAGNOSIS — N189 Chronic kidney disease, unspecified: Secondary | ICD-10-CM | POA: Diagnosis present

## 2019-05-11 DIAGNOSIS — I6529 Occlusion and stenosis of unspecified carotid artery: Secondary | ICD-10-CM | POA: Diagnosis present

## 2019-05-11 DIAGNOSIS — E782 Mixed hyperlipidemia: Secondary | ICD-10-CM | POA: Diagnosis present

## 2019-05-11 DIAGNOSIS — M797 Fibromyalgia: Secondary | ICD-10-CM | POA: Diagnosis present

## 2019-05-11 DIAGNOSIS — Z87891 Personal history of nicotine dependence: Secondary | ICD-10-CM

## 2019-05-11 DIAGNOSIS — J9601 Acute respiratory failure with hypoxia: Secondary | ICD-10-CM | POA: Diagnosis not present

## 2019-05-11 DIAGNOSIS — Z881 Allergy status to other antibiotic agents status: Secondary | ICD-10-CM | POA: Diagnosis not present

## 2019-05-11 DIAGNOSIS — Z7982 Long term (current) use of aspirin: Secondary | ICD-10-CM | POA: Diagnosis not present

## 2019-05-11 DIAGNOSIS — E119 Type 2 diabetes mellitus without complications: Secondary | ICD-10-CM | POA: Diagnosis not present

## 2019-05-11 DIAGNOSIS — I129 Hypertensive chronic kidney disease with stage 1 through stage 4 chronic kidney disease, or unspecified chronic kidney disease: Secondary | ICD-10-CM | POA: Diagnosis present

## 2019-05-11 DIAGNOSIS — Z23 Encounter for immunization: Secondary | ICD-10-CM | POA: Diagnosis present

## 2019-05-11 DIAGNOSIS — R338 Other retention of urine: Secondary | ICD-10-CM | POA: Diagnosis not present

## 2019-05-11 DIAGNOSIS — Z7984 Long term (current) use of oral hypoglycemic drugs: Secondary | ICD-10-CM | POA: Diagnosis not present

## 2019-05-11 DIAGNOSIS — I6522 Occlusion and stenosis of left carotid artery: Secondary | ICD-10-CM | POA: Diagnosis present

## 2019-05-11 HISTORY — PX: ENDARTERECTOMY: SHX5162

## 2019-05-11 LAB — GLUCOSE, CAPILLARY
Glucose-Capillary: 126 mg/dL — ABNORMAL HIGH (ref 70–99)
Glucose-Capillary: 144 mg/dL — ABNORMAL HIGH (ref 70–99)

## 2019-05-11 SURGERY — ENDARTERECTOMY, CAROTID
Anesthesia: General | Site: Neck | Laterality: Left

## 2019-05-11 MED ORDER — SODIUM CHLORIDE 0.9 % IV SOLN: INTRAVENOUS | Status: DC

## 2019-05-11 MED ORDER — LACTATED RINGERS IV SOLN
INTRAVENOUS | Status: DC
  Administered 2019-05-11: 09:00:00 via INTRAVENOUS

## 2019-05-11 MED ORDER — SODIUM CHLORIDE 0.9 % IV SOLN
INTRAVENOUS | Status: DC | PRN
  Administered 2019-05-11: 500 mL

## 2019-05-11 MED ORDER — CHLORHEXIDINE GLUCONATE CLOTH 2 % EX PADS: 6.0000 | MEDICATED_PAD | Freq: Once | CUTANEOUS | Status: DC

## 2019-05-11 MED ORDER — SODIUM CHLORIDE 0.9 % IV SOLN
INTRAVENOUS | Status: AC
  Filled 2019-05-11: qty 1.2

## 2019-05-11 MED ORDER — PROTAMINE SULFATE 10 MG/ML IV SOLN
INTRAVENOUS | Status: DC | PRN
  Administered 2019-05-11: 50 mg via INTRAVENOUS

## 2019-05-11 MED ORDER — FENTANYL CITRATE (PF) 100 MCG/2ML IJ SOLN
INTRAMUSCULAR | Status: AC
  Filled 2019-05-11: qty 2

## 2019-05-11 MED ORDER — EPHEDRINE 5 MG/ML INJ
INTRAVENOUS | Status: AC
  Filled 2019-05-11: qty 10

## 2019-05-11 MED ORDER — PANTOPRAZOLE SODIUM 40 MG PO TBEC
40.0000 mg | DELAYED_RELEASE_TABLET | Freq: Every day | ORAL | Status: DC
  Administered 2019-05-11 – 2019-05-12 (×2): 40 mg via ORAL
  Filled 2019-05-11 (×2): qty 1

## 2019-05-11 MED ORDER — METOPROLOL TARTRATE 5 MG/5ML IV SOLN: 2.0000 mg | INTRAVENOUS | Status: DC | PRN

## 2019-05-11 MED ORDER — COLESEVELAM HCL 625 MG PO TABS
1875.0000 mg | ORAL_TABLET | Freq: Two times a day (BID) | ORAL | Status: DC
  Administered 2019-05-11 – 2019-05-12 (×2): 1875 mg via ORAL
  Filled 2019-05-11 (×3): qty 3

## 2019-05-11 MED ORDER — SODIUM CHLORIDE 0.9 % IV SOLN
0.0125 ug/kg/min | INTRAVENOUS | Status: DC
  Administered 2019-05-11: .1 ug/kg/min via INTRAVENOUS
  Filled 2019-05-11 (×2): qty 2000

## 2019-05-11 MED ORDER — ACETAMINOPHEN 500 MG PO TABS
ORAL_TABLET | ORAL | Status: AC
  Filled 2019-05-11: qty 2

## 2019-05-11 MED ORDER — EPHEDRINE SULFATE-NACL 50-0.9 MG/10ML-% IV SOSY
PREFILLED_SYRINGE | INTRAVENOUS | Status: DC | PRN
  Administered 2019-05-11: 10 mg via INTRAVENOUS
  Administered 2019-05-11: 5 mg via INTRAVENOUS

## 2019-05-11 MED ORDER — GUAIFENESIN-DM 100-10 MG/5ML PO SYRP: 15.0000 mL | ORAL_SOLUTION | ORAL | Status: DC | PRN

## 2019-05-11 MED ORDER — PROMETHAZINE HCL 25 MG PO TABS: 25.0000 mg | ORAL_TABLET | Freq: Three times a day (TID) | ORAL | Status: DC | PRN

## 2019-05-11 MED ORDER — LACTATED RINGERS IV SOLN
INTRAVENOUS | Status: DC | PRN
  Administered 2019-05-11: 09:00:00 via INTRAVENOUS

## 2019-05-11 MED ORDER — MORPHINE SULFATE (PF) 2 MG/ML IV SOLN: 2.0000 mg | INTRAVENOUS | Status: DC | PRN

## 2019-05-11 MED ORDER — MIDAZOLAM HCL 2 MG/2ML IJ SOLN
INTRAMUSCULAR | Status: AC
  Filled 2019-05-11: qty 2

## 2019-05-11 MED ORDER — METHADONE HCL 10 MG PO TABS
5.0000 mg | ORAL_TABLET | Freq: Every day | ORAL | Status: DC
  Administered 2019-05-11 – 2019-05-12 (×5): 5 mg via ORAL
  Filled 2019-05-11 (×5): qty 1

## 2019-05-11 MED ORDER — SODIUM CHLORIDE 0.9 % IV SOLN
INTRAVENOUS | Status: DC | PRN
  Administered 2019-05-11: 10:00:00 30 ug/min via INTRAVENOUS

## 2019-05-11 MED ORDER — HEMOSTATIC AGENTS (NO CHARGE) OPTIME
TOPICAL | Status: DC | PRN
  Administered 2019-05-11: 1 via TOPICAL

## 2019-05-11 MED ORDER — 0.9 % SODIUM CHLORIDE (POUR BTL) OPTIME
TOPICAL | Status: DC | PRN
  Administered 2019-05-11 (×2): 1000 mL

## 2019-05-11 MED ORDER — PROPOFOL 10 MG/ML IV BOLUS
INTRAVENOUS | Status: AC
  Filled 2019-05-11: qty 20

## 2019-05-11 MED ORDER — FENTANYL CITRATE (PF) 250 MCG/5ML IJ SOLN
INTRAMUSCULAR | Status: AC
  Filled 2019-05-11: qty 5

## 2019-05-11 MED ORDER — PROTAMINE SULFATE 10 MG/ML IV SOLN
INTRAVENOUS | Status: AC
  Filled 2019-05-11: qty 5

## 2019-05-11 MED ORDER — DEXAMETHASONE SODIUM PHOSPHATE 10 MG/ML IJ SOLN
INTRAMUSCULAR | Status: AC
  Filled 2019-05-11: qty 1

## 2019-05-11 MED ORDER — ACETAMINOPHEN 325 MG RE SUPP: 325.0000 mg | RECTAL | Status: DC | PRN

## 2019-05-11 MED ORDER — ALUM & MAG HYDROXIDE-SIMETH 200-200-20 MG/5ML PO SUSP: 15.0000 mL | ORAL | Status: DC | PRN

## 2019-05-11 MED ORDER — DEXAMETHASONE SODIUM PHOSPHATE 10 MG/ML IJ SOLN
INTRAMUSCULAR | Status: DC | PRN
  Administered 2019-05-11: 10 mg via INTRAVENOUS

## 2019-05-11 MED ORDER — ONDANSETRON HCL 4 MG/2ML IJ SOLN
INTRAMUSCULAR | Status: DC | PRN
  Administered 2019-05-11: 4 mg via INTRAVENOUS

## 2019-05-11 MED ORDER — LIDOCAINE 2% (20 MG/ML) 5 ML SYRINGE
INTRAMUSCULAR | Status: DC | PRN
  Administered 2019-05-11: 60 mg via INTRAVENOUS

## 2019-05-11 MED ORDER — ALBUMIN HUMAN 5 % IV SOLN
INTRAVENOUS | Status: AC
  Filled 2019-05-11: qty 250

## 2019-05-11 MED ORDER — SUGAMMADEX SODIUM 200 MG/2ML IV SOLN
INTRAVENOUS | Status: DC | PRN
  Administered 2019-05-11: 110 mg via INTRAVENOUS

## 2019-05-11 MED ORDER — ONDANSETRON HCL 4 MG/2ML IJ SOLN
INTRAMUSCULAR | Status: AC
  Filled 2019-05-11: qty 2

## 2019-05-11 MED ORDER — HYDRALAZINE HCL 20 MG/ML IJ SOLN: 5.0000 mg | INTRAMUSCULAR | Status: DC | PRN

## 2019-05-11 MED ORDER — ASPIRIN EC 81 MG PO TBEC
81.0000 mg | DELAYED_RELEASE_TABLET | Freq: Every day | ORAL | Status: DC
  Administered 2019-05-12: 81 mg via ORAL
  Filled 2019-05-11: qty 1

## 2019-05-11 MED ORDER — GLYCOPYRROLATE PF 0.2 MG/ML IJ SOSY
PREFILLED_SYRINGE | INTRAMUSCULAR | Status: AC
  Filled 2019-05-11: qty 1

## 2019-05-11 MED ORDER — ACETAMINOPHEN 325 MG PO TABS: 325.0000 mg | ORAL_TABLET | ORAL | Status: DC | PRN

## 2019-05-11 MED ORDER — POTASSIUM CHLORIDE CRYS ER 20 MEQ PO TBCR: 20.0000 meq | EXTENDED_RELEASE_TABLET | Freq: Every day | ORAL | Status: DC | PRN

## 2019-05-11 MED ORDER — DOCUSATE SODIUM 100 MG PO CAPS
100.0000 mg | ORAL_CAPSULE | Freq: Every day | ORAL | Status: DC
  Administered 2019-05-12: 100 mg via ORAL
  Filled 2019-05-11: qty 1

## 2019-05-11 MED ORDER — ROCURONIUM BROMIDE 50 MG/5ML IV SOSY
PREFILLED_SYRINGE | INTRAVENOUS | Status: DC | PRN
  Administered 2019-05-11: 50 mg via INTRAVENOUS

## 2019-05-11 MED ORDER — ONDANSETRON HCL 4 MG/2ML IJ SOLN: 4.0000 mg | Freq: Four times a day (QID) | INTRAMUSCULAR | Status: DC | PRN

## 2019-05-11 MED ORDER — LACTATED RINGERS IV BOLUS
250.0000 mL | Freq: Once | INTRAVENOUS | Status: AC
  Administered 2019-05-11: 250 mL via INTRAVENOUS

## 2019-05-11 MED ORDER — LABETALOL HCL 5 MG/ML IV SOLN: 10.0000 mg | INTRAVENOUS | Status: DC | PRN

## 2019-05-11 MED ORDER — HYDRALAZINE HCL 10 MG PO TABS
10.0000 mg | ORAL_TABLET | Freq: Two times a day (BID) | ORAL | Status: DC
  Administered 2019-05-11 – 2019-05-12 (×2): 10 mg via ORAL
  Filled 2019-05-11 (×2): qty 1

## 2019-05-11 MED ORDER — PROPOFOL 10 MG/ML IV BOLUS
INTRAVENOUS | Status: DC | PRN
  Administered 2019-05-11: 50 mg via INTRAVENOUS
  Administered 2019-05-11: 100 mg via INTRAVENOUS
  Administered 2019-05-11: 20 mg via INTRAVENOUS

## 2019-05-11 MED ORDER — ACETAMINOPHEN 500 MG PO TABS
1000.0000 mg | ORAL_TABLET | Freq: Once | ORAL | Status: AC
  Administered 2019-05-11: 1000 mg via ORAL

## 2019-05-11 MED ORDER — FENTANYL CITRATE (PF) 100 MCG/2ML IJ SOLN
INTRAMUSCULAR | Status: DC | PRN
  Administered 2019-05-11: 100 ug via INTRAVENOUS

## 2019-05-11 MED ORDER — CEFAZOLIN SODIUM-DEXTROSE 2-4 GM/100ML-% IV SOLN
2.0000 g | Freq: Two times a day (BID) | INTRAVENOUS | Status: AC
  Administered 2019-05-11 – 2019-05-12 (×2): 2 g via INTRAVENOUS
  Filled 2019-05-11 (×2): qty 100

## 2019-05-11 MED ORDER — ONDANSETRON HCL 4 MG/2ML IJ SOLN: 4.0000 mg | Freq: Once | INTRAMUSCULAR | Status: DC | PRN

## 2019-05-11 MED ORDER — HEPARIN SODIUM (PORCINE) 1000 UNIT/ML IJ SOLN
INTRAMUSCULAR | Status: DC | PRN
  Administered 2019-05-11: 2000 [IU] via INTRAVENOUS
  Administered 2019-05-11: 5000 [IU] via INTRAVENOUS

## 2019-05-11 MED ORDER — MIRTAZAPINE 15 MG PO TABS
30.0000 mg | ORAL_TABLET | Freq: Every day | ORAL | Status: DC
  Administered 2019-05-11: 30 mg via ORAL
  Filled 2019-05-11: qty 2

## 2019-05-11 MED ORDER — LIDOCAINE HCL (PF) 1 % IJ SOLN
INTRAMUSCULAR | Status: AC
  Filled 2019-05-11: qty 30

## 2019-05-11 MED ORDER — POLYETHYLENE GLYCOL 3350 17 G PO PACK: 17.0000 g | PACK | Freq: Every day | ORAL | Status: DC | PRN

## 2019-05-11 MED ORDER — MAGNESIUM SULFATE 2 GM/50ML IV SOLN: 2.0000 g | Freq: Every day | INTRAVENOUS | Status: DC | PRN

## 2019-05-11 MED ORDER — ALBUMIN HUMAN 5 % IV SOLN
12.5000 g | Freq: Once | INTRAVENOUS | Status: AC
  Administered 2019-05-11: 12.5 g via INTRAVENOUS

## 2019-05-11 MED ORDER — FENTANYL CITRATE (PF) 100 MCG/2ML IJ SOLN
25.0000 ug | INTRAMUSCULAR | Status: DC | PRN
  Administered 2019-05-11 (×3): 25 ug via INTRAVENOUS

## 2019-05-11 MED ORDER — DICYCLOMINE HCL 20 MG PO TABS
20.0000 mg | ORAL_TABLET | Freq: Three times a day (TID) | ORAL | Status: DC
  Administered 2019-05-11 – 2019-05-12 (×3): 20 mg via ORAL
  Filled 2019-05-11 (×4): qty 1

## 2019-05-11 MED ORDER — PHENOL 1.4 % MT LIQD: 1.0000 | OROMUCOSAL | Status: DC | PRN

## 2019-05-11 MED ORDER — TRAMADOL HCL 50 MG PO TABS: 50.0000 mg | ORAL_TABLET | Freq: Four times a day (QID) | ORAL | Status: DC | PRN

## 2019-05-11 MED ORDER — SODIUM CHLORIDE 0.9 % IV SOLN: 500.0000 mL | Freq: Once | INTRAVENOUS | Status: DC | PRN

## 2019-05-11 SURGICAL SUPPLY — 46 items
ADH SKN CLS APL DERMABOND .7 (GAUZE/BANDAGES/DRESSINGS) ×1
CANISTER SUCT 3000ML PPV (MISCELLANEOUS) ×3 IMPLANT
CATH ROBINSON RED A/P 18FR (CATHETERS) ×3 IMPLANT
CLIP VESOCCLUDE MED 24/CT (CLIP) ×3 IMPLANT
CLIP VESOCCLUDE SM WIDE 24/CT (CLIP) ×3 IMPLANT
COVER PROBE W GEL 5X96 (DRAPES) ×3 IMPLANT
COVER WAND RF STERILE (DRAPES) ×3 IMPLANT
DERMABOND ADVANCED (GAUZE/BANDAGES/DRESSINGS) ×2
DERMABOND ADVANCED .7 DNX12 (GAUZE/BANDAGES/DRESSINGS) ×1 IMPLANT
DRAIN CHANNEL 15F RND FF W/TCR (WOUND CARE) IMPLANT
ELECT REM PT RETURN 9FT ADLT (ELECTROSURGICAL) ×3
ELECTRODE REM PT RTRN 9FT ADLT (ELECTROSURGICAL) ×1 IMPLANT
EVACUATOR SILICONE 100CC (DRAIN) IMPLANT
GLOVE BIO SURGEON STRL SZ7.5 (GLOVE) ×6 IMPLANT
GLOVE SS BIOGEL STRL SZ 6.5 (GLOVE) ×1 IMPLANT
GLOVE SUPERSENSE BIOGEL SZ 6.5 (GLOVE) ×2
GOWN STRL REUS W/ TWL LRG LVL3 (GOWN DISPOSABLE) ×2 IMPLANT
GOWN STRL REUS W/ TWL XL LVL3 (GOWN DISPOSABLE) ×1 IMPLANT
GOWN STRL REUS W/TWL LRG LVL3 (GOWN DISPOSABLE) ×6
GOWN STRL REUS W/TWL XL LVL3 (GOWN DISPOSABLE) ×3
HEMOSTAT SNOW SURGICEL 2X4 (HEMOSTASIS) IMPLANT
INSERT FOGARTY SM (MISCELLANEOUS) ×3 IMPLANT
IV ADAPTER SYR DOUBLE MALE LL (MISCELLANEOUS) IMPLANT
KIT BASIN OR (CUSTOM PROCEDURE TRAY) ×3 IMPLANT
KIT SHUNT ARGYLE CAROTID ART 6 (VASCULAR PRODUCTS) ×3 IMPLANT
KIT TURNOVER KIT B (KITS) ×3 IMPLANT
NEEDLE HYPO 25GX1X1/2 BEV (NEEDLE) IMPLANT
NEEDLE SPNL 20GX3.5 QUINCKE YW (NEEDLE) IMPLANT
NS IRRIG 1000ML POUR BTL (IV SOLUTION) ×9 IMPLANT
PACK CAROTID (CUSTOM PROCEDURE TRAY) ×3 IMPLANT
PAD ARMBOARD 7.5X6 YLW CONV (MISCELLANEOUS) ×6 IMPLANT
PATCH HEMASHIELD 8X75 (Vascular Products) ×3 IMPLANT
POSITIONER HEAD DONUT 9IN (MISCELLANEOUS) ×3 IMPLANT
STOPCOCK 4 WAY LG BORE MALE ST (IV SETS) IMPLANT
SUT ETHILON 3 0 PS 1 (SUTURE) IMPLANT
SUT MNCRL AB 4-0 PS2 18 (SUTURE) ×3 IMPLANT
SUT PROLENE 6 0 BV (SUTURE) ×9 IMPLANT
SUT PROLENE 7 0 BV1 MDA (SUTURE) ×3 IMPLANT
SUT SILK 3 0 (SUTURE)
SUT SILK 3-0 18XBRD TIE 12 (SUTURE) IMPLANT
SUT VIC AB 3-0 SH 27 (SUTURE) ×3
SUT VIC AB 3-0 SH 27X BRD (SUTURE) ×1 IMPLANT
SYR CONTROL 10ML LL (SYRINGE) IMPLANT
TOWEL GREEN STERILE (TOWEL DISPOSABLE) ×3 IMPLANT
TUBING ART PRESS 48 MALE/FEM (TUBING) IMPLANT
WATER STERILE IRR 1000ML POUR (IV SOLUTION) ×3 IMPLANT

## 2019-05-11 NOTE — Op Note (Addendum)
° ° °  Patient name: Danielle Fuller MRN: [id number] DOB: March 18, 1946 Sex: female  05/11/2019 Pre-operative Diagnosis: Asymptomatic high-grade left internal carotid artery stenosis Post-operative diagnosis:  Same Surgeon:  Erlene Quan C. Donzetta Matters, MD Assistant: Arlee Muslim, PA Procedure Performed:  Left carotid endarterectomy with Dacron patch angioplasty  Indications: 73 year old female with a history of visual disturbance on the left this was approximately 10 months ago.  She is subsequently found to have high-grade stenosis of the left ICA.  She underwent CT scan which demonstrated high-grade calcification was not thought to be candidate for stenting.  She is known to care for left carotid endarterectomy.  Findings: Stenosis was just past the carotid bifurcation up into the ICA.  There is tract for a few centimeters.  Distally there was one area that we tacked on the lateral aspect.  I did not place the shunt as there is very strong backbleeding and the artery was quite diminutive distally.  At completion there was good signal within the ICA suggested low resistance outflow.   Procedure:  The patient was identified in the holding area and taken to the operating room where she put supine operative general anesthesia was induced.  She was sterilely prepped and draped in the left neck chest usual fashion antibiotics were minister timeout was called.  An incision was made along the anterior border sternocleidomastoid.  We dissected down to the common carotid artery.  It was actually quite low bifurcation.  We divided the omohyoid muscle.  We placed a umbilical tape around the common carotid artery patient was fully heparinized.  ACT was checked and returned a little bit low and additional 2000 units of heparin was given.  We dissected up onto the superior thyroidal and external placed Vesseloops around these.  We then identified our hypoglossal nerve and dissected out our internal carotid artery where it appeared  normal and placed a vessel loop around this.  Blood pressure was systolic of [id number] mmHg.  A 10 French shunt was prepared but the ICA distally did look quite small.  I then clamped the internal followed by the common external carotid arteries.  I opened the vessel longitudinally.  I extended up a few centimeters onto the internal.  It was all just a thickened artery was quite small distally.  I had very strong backbleeding I elected not to place a shunt.  Endarterectomy was performed.  There was no vessel disease going into the external so eversion was not performed.  I then sewed a dacryon patch in place with 6-0 Prolene suture.  Prior completion allowed flushing all directions and thoroughly irrigated the artery itself.  Upon completion I then removed my clamp off the external followed by the common and after several cardiac cycles the internal.  There was good signal in the internal.  I did place a repair suture high on the medial aspect but again there was a good signal in the internal.  50 mg of protamine was administered we obtain hemostasis in the wound bed.  We irrigated closed in layers the platysma with Vicryl the skin with Monocryl.  Dermabond placed to level skin.  She was then awakened anesthesia and was neuro in tact and was transferred to recovery in stable condition   EBL: 100 cc   Wateen Varon C. Donzetta Matters, MD [facility] Office: [phone] Pager: [phone]

## 2019-05-11 NOTE — Anesthesia Postprocedure Evaluation (Signed)
Anesthesia Post Note  Patient: Danielle Fuller  Procedure(s) Performed: ENDARTERECTOMY CAROTID (Left Neck)     Patient location during evaluation: PACU Anesthesia Type: General Level of consciousness: awake and alert Pain management: pain level controlled Vital Signs Assessment: post-procedure vital signs reviewed and stable Respiratory status: spontaneous breathing, nonlabored ventilation, respiratory function stable and patient connected to nasal cannula oxygen Cardiovascular status: blood pressure returned to baseline and stable Postop Assessment: no apparent nausea or vomiting Anesthetic complications: no    Last Vitals:  Vitals:   05/11/19 1530 05/11/19 1600  BP:  (!) 105/49  Pulse:  68  Resp:    Temp: 37 C   SpO2:  94%    Last Pain:  Vitals:   05/11/19 1320  PainSc: Asleep                 Lelar Farewell P Kevia Zaucha

## 2019-05-11 NOTE — H&P (Signed)
HPI:  Danielle Fuller is a 73 y.o. female has a history of visual disturbance that occurred in December of last year. She underwent duplex which demonstrated concern for left ICA stenosis at that time. Since that time she has done well no further stroke TIA no amaurosis. States that she does take aspirin daily and also takes WelChol as she cannot tolerate statins. She has seen a cardiologist before secondary to abnormal echo but does not know of any specific coronary disease.         Past Medical History:  Diagnosis   . Diabetes mellitus without complication ([place])   . Fibromyalgia   . Neuropathy   . RSD (reflex sympathetic dystrophy)         Family History  Problem Relation Age of Onset  . Stroke Mother   . Heart disease Father         Past Surgical History:  Procedure Laterality Date  . ANKLE FRACTURE SURGERY    . CARPAL TUNNEL RELEASE    . IR RADIOLOGIST EVAL & MGMT  01/07/2017  . IR RADIOLOGIST EVAL & MGMT  02/02/2017  . IR VERTEBROPLASTY CERV/THOR BX INC UNI/BIL INC/INJECT/IMAGING  01/14/2017  . IR VERTEBROPLASTY CERV/THOR BX INC UNI/BIL INC/INJECT/IMAGING  03/01/2017  . IR VERTEBROPLASTY EA ADDL (T&LS) BX INC UNI/BIL INC INJECT/IMAGING  01/14/2017  . KNEE ARTHROSCOPY    Short Social History:  Social History        Tobacco Use  . Smoking status: Former Smoker    Types: Cigarettes  . Smokeless tobacco: Never Used  Substance Use Topics  . Alcohol use: No  No Known Allergies        Current Outpatient Medications  Medication Sig Dispense Refill  . baclofen (LIORESAL) 20 MG tablet Take 20 mg by mouth 3 (three) times daily as needed for muscle spasms.    . calcitonin, salmon, (MIACALCIN/FORTICAL) 200 UNIT/ACT nasal spray Place 1 spray into alternate nostrils daily.    . colesevelam (WELCHOL) 625 MG tablet Take 1,875 mg by mouth 2 (two) times daily with a meal.    . Cyanocobalamin (B-12 COMPLIANCE INJECTION) 1000 MCG/ML KIT Inject 1 mL as directed every 30 (thirty) days.     . diclofenac sodium (VOLTAREN) 1 % GEL Apply 2 g topically 4 (four) times daily. 100 g 0  . dicyclomine (BENTYL) 20 MG tablet Take 20 mg by mouth 3 (three) times daily before meals.    . fluticasone (FLONASE) 50 MCG/ACT nasal spray Place 2 sprays into both nostrils daily.    . magnesium oxide (MAG-OX) 400 MG tablet Take 400 mg by mouth 2 (two) times daily.    . metFORMIN (GLUCOPHAGE-XR) 500 MG 24 hr tablet Take 500-1,000 mg by mouth See admin instructions. Take 500 mg every morning  Take 1000 mg every evening     . methadone (DOLOPHINE) 5 MG tablet Take 5 mg by mouth 5 (five) times daily.    . promethazine (PHENERGAN) 25 MG tablet Take 25 mg by mouth 3 (three) times daily as needed for nausea or vomiting.    . ranitidine (ZANTAC) 150 MG tablet Take 150 mg by mouth 2 (two) times daily.     No current facility-administered medications for this visit.   Review of Systems  Constitutional: Constitutional negative.  HENT: HENT negative.  Eyes: Positive for visual disturbance.  Respiratory: Positive for cough, shortness of breath and wheezing.  Cardiovascular: Positive for claudication.  GI: Gastrointestinal negative.  Musculoskeletal: Musculoskeletal negative.  Skin: Skin  negative.  Neurological: Neurological negative.  Hematologic: Hematologic/lymphatic negative.  Psychiatric: Psychiatric negative.   Objective:   Vitals:   05/11/19 0804  BP: (!) 183/70  Pulse: 80  Resp: 18  Temp: 98.3 F (36.8 C)  SpO2: 94%    Physical Exam  Neck:  Musculoskeletal: Normal range of motion and neck supple.  Cardiovascular:  Rate and Rhythm: Normal rate and regular rhythm.  Pulses:  Radial pulses are 2+ on the right side and 2+ on the left side.  Femoral pulses are 2+ on the right side and 2+ on the left side. Comments: Bilateral carotid bruits Abdominal:  Palpations: Abdomen is soft.  Musculoskeletal: Normal range of motion.  General: No swelling.  Skin:  General: Skin is warm and dry.   Neurological:  General: No focal deficit present.  Mental Status: She is alert and oriented to person, place, and time.  Psychiatric:  Mood and Affect: Mood normal.  Behavior: Behavior normal.  Thought Content: Thought content normal.  Judgment: Judgment normal.   Data:   I have independently interpreted her bilateral carotid duplex which demonstrates 40 to 59% stenosis on the right peak systolic velocity 009 in the left is 80 to 99% stenosis and heterogeneous with peak systolic velocity 794 and end-diastolic velocity 997.   Assessment/Plan:   73 year old female presents with 80-99% stenosis left ICA with peak systolic velocity 182 and end-diastolic 099.  CT has demonstrated high-grade stenosis as well.  She has undergone cardiac catheterization which was normal.  She is now indicated for left carotid endarterectomy.   Danielle Slabach C. Donzetta Matters, MD [facility] Office: [phone] Pager: [phone]

## 2019-05-11 NOTE — Transfer of Care (Signed)
Immediate Anesthesia Transfer of Care Note  Patient: Danielle Fuller  Procedure(s) Performed: ENDARTERECTOMY CAROTID (Left Neck)  Patient Location: PACU  Anesthesia Type:General  Level of Consciousness: awake and patient cooperative  Airway & Oxygen Therapy: Patient Spontanous Breathing and Patient connected to face mask oxygen  Post-op Assessment: Report given to RN and Post -op Vital signs reviewed and stable  Post vital signs: Reviewed and stable  Last Vitals:  Vitals Value Taken Time  BP 112/47 05/11/19 1150  Temp    Pulse 82 05/11/19 1153  Resp 15 05/11/19 1153  SpO2 97 % 05/11/19 1153  Vitals shown include unvalidated device data.  Last Pain:  Vitals:   05/11/19 0832  PainSc: 5          Complications: No apparent anesthesia complications

## 2019-05-11 NOTE — Anesthesia Procedure Notes (Signed)
Procedure Name: Intubation Date/Time: 05/11/2019 10:03 AM Performed by: Lance Coon, CRNA Pre-anesthesia Checklist: Patient identified, Emergency Drugs available, Suction available, Patient being monitored and Timeout performed Patient Re-evaluated:Patient Re-evaluated prior to induction Oxygen Delivery Method: Circle system utilized Preoxygenation: Pre-oxygenation with 100% oxygen Induction Type: IV induction Ventilation: Mask ventilation without difficulty Laryngoscope Size: Miller and 2 Grade View: Grade I Tube type: Oral Tube size: 7.0 mm Number of attempts: 1 Airway Equipment and Method: Stylet Placement Confirmation: ETT inserted through vocal cords under direct vision,  positive ETCO2 and breath sounds checked- equal and bilateral Secured at: 21 cm Tube secured with: Tape Dental Injury: Teeth and Oropharynx as per pre-operative assessment

## 2019-05-11 NOTE — Discharge Instructions (Signed)
   [facility]  Discharge Instructions   Carotid Endarterectomy (CEA)  Please refer to the following instructions for your post-procedure care. Your surgeon or physician assistant will discuss any changes with you.  Activity  You are encouraged to walk as much as you can. You can slowly return to normal activities but must avoid strenuous activity and heavy lifting until your doctor tell you it's OK. Avoid activities such as vacuuming or swinging a golf club. You can drive after one week if you are comfortable and you are no longer taking prescription pain medications. It is normal to feel tired for serval weeks after your surgery. It is also normal to have difficulty with sleep habits, eating, and bowel movements after surgery. These will go away with time.  Bathing/Showering  You may shower after you come home. Do not soak in a bathtub, hot tub, or swim until the incision heals completely.  Incision Care  Shower every day. Clean your incision with mild soap and water. Pat the area dry with a clean towel. You do not need a bandage unless otherwise instructed. Do not apply any ointments or creams to your incision. You may have skin glue on your incision. Do not peel it off. It will come off on its own in about one week. Your incision may feel thickened and raised for several weeks after your surgery. This is normal and the skin will soften over time. For Men Only: It's OK to shave around the incision but do not shave the incision itself for 2 weeks. It is common to have numbness under your chin that could last for several months.  Diet  Resume your normal diet. There are no special food restrictions following this procedure. A low fat/low cholesterol diet is recommended for all patients with vascular disease. In order to heal from your surgery, it is CRITICAL to get adequate nutrition. Your body requires vitamins, minerals, and protein. Vegetables are the best  source of vitamins and minerals. Vegetables also provide the perfect balance of protein. Processed food has little nutritional value, so try to avoid this.        Medications  Resume taking all of your medications unless your doctor or physician assistant tells you not to. If your incision is causing pain, you may take over-the- counter pain relievers such as acetaminophen (Tylenol). If you were prescribed a stronger pain medication, please be aware these medications can cause nausea and constipation. Prevent nausea by taking the medication with a snack or meal. Avoid constipation by drinking plenty of fluids and eating foods with a high amount of fiber, such as fruits, vegetables, and grains. Do not take Tylenol if you are taking prescription pain medications.  Follow Up  Our office will schedule a follow up appointment 2-3 weeks following discharge.  Please call us immediately for any of the following conditions  Increased pain, redness, drainage (pus) from your incision site. Fever of 101 degrees or higher. If you should develop stroke (slurred speech, difficulty swallowing, weakness on one side of your body, loss of vision) you should call 911 and go to the nearest emergency room.  Reduce your risk of vascular disease:  Stop smoking. If you would like help call [facility] at 1-800-QUIT-NOW (1-800-784-8669) or [facility] at 336-586-4000. Manage your cholesterol Maintain a desired weight Control your diabetes Keep your blood pressure down  If you have any questions, please call the office at [phone].   

## 2019-05-11 NOTE — Progress Notes (Signed)
PHARMACY NOTE:  ANTIMICROBIAL RENAL DOSAGE ADJUSTMENT  Current antimicrobial regimen includes a mismatch between antimicrobial dosage and estimated renal function.  As per policy approved by the Pharmacy & Therapeutics and Medical Executive Committees, the antimicrobial dosage will be adjusted accordingly.  Current antimicrobial dosage:  Cefazolin Q8 hr   Indication: postop prophylaxis  Renal Function:  Estimated Creatinine Clearance: 22.7 mL/min (A) (by C-G formula based on SCr of 1.5 mg/dL (H)).  Antimicrobial dosage has been changed to:  Cefazolin Q12 hr   Thank you for allowing pharmacy to be a part of this patient's care.  Benetta Spar, PharmD, BCPS, [facility] Clinical Pharmacist

## 2019-05-11 NOTE — Anesthesia Procedure Notes (Signed)
Arterial Line Insertion Start/End10/22/2020 9:15 AM Performed by: Babs Bertin, CRNA, CRNA  Patient location: Pre-op. Preanesthetic checklist: patient identified, IV checked, site marked, risks and benefits discussed, surgical consent, monitors and equipment checked, pre-op evaluation, timeout performed and anesthesia consent Lidocaine 1% used for infiltration Left, radial was placed Catheter size: 20 G Hand hygiene performed  and maximum sterile barriers used   Attempts: 2 Procedure performed without using ultrasound guided technique. Following insertion, dressing applied and Biopatch. Post procedure assessment: normal  Patient tolerated the procedure well with no immediate complications.

## 2019-05-12 ENCOUNTER — Emergency Department (HOSPITAL_COMMUNITY): Payer: Medicare Other

## 2019-05-12 ENCOUNTER — Encounter (HOSPITAL_COMMUNITY): Payer: Self-pay | Admitting: Vascular Surgery

## 2019-05-12 ENCOUNTER — Inpatient Hospital Stay (HOSPITAL_COMMUNITY)
Admission: EM | Admit: 2019-05-12 | Discharge: 2019-05-16 | DRG: 177 | Disposition: A | Discharge status: Home / Self Care | Payer: Medicare Other | Attending: Internal Medicine | Admitting: Internal Medicine

## 2019-05-12 ENCOUNTER — Other Ambulatory Visit: Payer: Self-pay

## 2019-05-12 DIAGNOSIS — I6529 Occlusion and stenosis of unspecified carotid artery: Secondary | ICD-10-CM | POA: Diagnosis present

## 2019-05-12 DIAGNOSIS — R0602 Shortness of breath: Secondary | ICD-10-CM | POA: Diagnosis not present

## 2019-05-12 DIAGNOSIS — J45909 Unspecified asthma, uncomplicated: Secondary | ICD-10-CM | POA: Diagnosis present

## 2019-05-12 DIAGNOSIS — Z7984 Long term (current) use of oral hypoglycemic drugs: Secondary | ICD-10-CM

## 2019-05-12 DIAGNOSIS — I5032 Chronic diastolic (congestive) heart failure: Secondary | ICD-10-CM | POA: Diagnosis present

## 2019-05-12 DIAGNOSIS — N184 Chronic kidney disease, stage 4 (severe): Secondary | ICD-10-CM | POA: Diagnosis present

## 2019-05-12 DIAGNOSIS — E119 Type 2 diabetes mellitus without complications: Secondary | ICD-10-CM

## 2019-05-12 DIAGNOSIS — Z87891 Personal history of nicotine dependence: Secondary | ICD-10-CM

## 2019-05-12 DIAGNOSIS — E782 Mixed hyperlipidemia: Secondary | ICD-10-CM | POA: Diagnosis present

## 2019-05-12 DIAGNOSIS — Z20828 Contact with and (suspected) exposure to other viral communicable diseases: Secondary | ICD-10-CM | POA: Diagnosis present

## 2019-05-12 DIAGNOSIS — R338 Other retention of urine: Secondary | ICD-10-CM | POA: Diagnosis present

## 2019-05-12 DIAGNOSIS — G9050 Complex regional pain syndrome I, unspecified: Secondary | ICD-10-CM | POA: Diagnosis present

## 2019-05-12 DIAGNOSIS — Z823 Family history of stroke: Secondary | ICD-10-CM

## 2019-05-12 DIAGNOSIS — J690 Pneumonitis due to inhalation of food and vomit: Principal | ICD-10-CM | POA: Diagnosis present

## 2019-05-12 DIAGNOSIS — Z79891 Long term (current) use of opiate analgesic: Secondary | ICD-10-CM

## 2019-05-12 DIAGNOSIS — N9989 Other postprocedural complications and disorders of genitourinary system: Secondary | ICD-10-CM | POA: Diagnosis present

## 2019-05-12 DIAGNOSIS — I130 Hypertensive heart and chronic kidney disease with heart failure and stage 1 through stage 4 chronic kidney disease, or unspecified chronic kidney disease: Secondary | ICD-10-CM | POA: Diagnosis present

## 2019-05-12 DIAGNOSIS — I5189 Other ill-defined heart diseases: Secondary | ICD-10-CM

## 2019-05-12 DIAGNOSIS — G2581 Restless legs syndrome: Secondary | ICD-10-CM | POA: Diagnosis present

## 2019-05-12 DIAGNOSIS — Z9889 Other specified postprocedural states: Secondary | ICD-10-CM

## 2019-05-12 DIAGNOSIS — Z791 Long term (current) use of non-steroidal anti-inflammatories (NSAID): Secondary | ICD-10-CM

## 2019-05-12 DIAGNOSIS — E1122 Type 2 diabetes mellitus with diabetic chronic kidney disease: Secondary | ICD-10-CM | POA: Diagnosis present

## 2019-05-12 DIAGNOSIS — Z9841 Cataract extraction status, right eye: Secondary | ICD-10-CM

## 2019-05-12 DIAGNOSIS — Z90710 Acquired absence of both cervix and uterus: Secondary | ICD-10-CM

## 2019-05-12 DIAGNOSIS — Z9981 Dependence on supplemental oxygen: Secondary | ICD-10-CM

## 2019-05-12 DIAGNOSIS — I2510 Atherosclerotic heart disease of native coronary artery without angina pectoris: Secondary | ICD-10-CM | POA: Diagnosis present

## 2019-05-12 DIAGNOSIS — Z881 Allergy status to other antibiotic agents status: Secondary | ICD-10-CM

## 2019-05-12 DIAGNOSIS — M797 Fibromyalgia: Secondary | ICD-10-CM | POA: Diagnosis present

## 2019-05-12 DIAGNOSIS — G4733 Obstructive sleep apnea (adult) (pediatric): Secondary | ICD-10-CM | POA: Diagnosis present

## 2019-05-12 DIAGNOSIS — K219 Gastro-esophageal reflux disease without esophagitis: Secondary | ICD-10-CM | POA: Diagnosis present

## 2019-05-12 DIAGNOSIS — E1140 Type 2 diabetes mellitus with diabetic neuropathy, unspecified: Secondary | ICD-10-CM | POA: Diagnosis present

## 2019-05-12 DIAGNOSIS — J449 Chronic obstructive pulmonary disease, unspecified: Secondary | ICD-10-CM | POA: Diagnosis present

## 2019-05-12 DIAGNOSIS — R0902 Hypoxemia: Secondary | ICD-10-CM | POA: Diagnosis present

## 2019-05-12 DIAGNOSIS — Z7982 Long term (current) use of aspirin: Secondary | ICD-10-CM

## 2019-05-12 DIAGNOSIS — Z8249 Family history of ischemic heart disease and other diseases of the circulatory system: Secondary | ICD-10-CM

## 2019-05-12 DIAGNOSIS — Z79899 Other long term (current) drug therapy: Secondary | ICD-10-CM

## 2019-05-12 DIAGNOSIS — Z7951 Long term (current) use of inhaled steroids: Secondary | ICD-10-CM

## 2019-05-12 DIAGNOSIS — J9601 Acute respiratory failure with hypoxia: Secondary | ICD-10-CM | POA: Diagnosis present

## 2019-05-12 DIAGNOSIS — Z9842 Cataract extraction status, left eye: Secondary | ICD-10-CM

## 2019-05-12 HISTORY — DX: Occlusion and stenosis of left carotid artery: I65.22

## 2019-05-12 LAB — BASIC METABOLIC PANEL
Anion gap: 10 (ref 5–15)
Anion gap: 9 (ref 5–15)
Anion gap: 10 (ref 5–15)
BUN: 30 mg/dL — ABNORMAL HIGH (ref 8–23)
BUN: 30 mg/dL — ABNORMAL HIGH (ref 8–23)
BUN: 29 mg/dL — ABNORMAL HIGH (ref 8–23)
CO2: 25 mmol/L (ref 22–32)
CO2: 26 mmol/L (ref 22–32)
CO2: 23 mmol/L (ref 22–32)
Calcium: 6.8 mg/dL — ABNORMAL LOW (ref 8.9–10.3)
Calcium: 8.6 mg/dL — ABNORMAL LOW (ref 8.9–10.3)
Calcium: 8.5 mg/dL — ABNORMAL LOW (ref 8.9–10.3)
Chloride: 104 mmol/L (ref 98–111)
Chloride: 110 mmol/L (ref 98–111)
Chloride: 106 mmol/L (ref 98–111)
Creatinine, Ser: 1.72 mg/dL — ABNORMAL HIGH (ref 0.44–1.00)
Creatinine, Ser: 1.82 mg/dL — ABNORMAL HIGH (ref 0.44–1.00)
Creatinine, Ser: 1.76 mg/dL — ABNORMAL HIGH (ref 0.44–1.00)
GFR calc Af Amer: 33 mL/min — ABNORMAL LOW (ref >60)
GFR calc Af Amer: 31 mL/min — ABNORMAL LOW (ref >60)
GFR calc Af Amer: 32 mL/min — ABNORMAL LOW (ref >60)
GFR calc non Af Amer: 29 mL/min — ABNORMAL LOW (ref >60)
GFR calc non Af Amer: 27 mL/min — ABNORMAL LOW (ref >60)
GFR calc non Af Amer: 28 mL/min — ABNORMAL LOW (ref >60)
Glucose, Bld: 132 mg/dL — ABNORMAL HIGH (ref 70–99)
Glucose, Bld: 81 mg/dL (ref 70–99)
Glucose, Bld: 150 mg/dL — ABNORMAL HIGH (ref 70–99)
Potassium: 6.2 mmol/L — ABNORMAL HIGH (ref 3.5–5.1)
Potassium: 4.2 mmol/L (ref 3.5–5.1)
Potassium: 4.3 mmol/L (ref 3.5–5.1)
Sodium: 139 mmol/L (ref 135–145)
Sodium: 145 mmol/L (ref 135–145)
Sodium: 139 mmol/L (ref 135–145)

## 2019-05-12 LAB — CBC
HCT: 25.9 % — ABNORMAL LOW (ref 36.0–46.0)
HCT: 28.2 % — ABNORMAL LOW (ref 36.0–46.0)
Hemoglobin: 8 g/dL — ABNORMAL LOW (ref 12.0–15.0)
Hemoglobin: 8.9 g/dL — ABNORMAL LOW (ref 12.0–15.0)
MCH: 30.4 pg (ref 26.0–34.0)
MCH: 31.3 pg (ref 26.0–34.0)
MCHC: 30.9 g/dL (ref 30.0–36.0)
MCHC: 31.6 g/dL (ref 30.0–36.0)
MCV: 98.5 fL (ref 80.0–100.0)
MCV: 99.3 fL (ref 80.0–100.0)
Platelets: 147 10*3/uL — ABNORMAL LOW (ref 150–400)
Platelets: 167 10*3/uL (ref 150–400)
RBC: 2.63 MIL/uL — ABNORMAL LOW (ref 3.87–5.11)
RBC: 2.84 MIL/uL — ABNORMAL LOW (ref 3.87–5.11)
RDW: 13.1 % (ref 11.5–15.5)
RDW: 13.2 % (ref 11.5–15.5)
WBC: 7 10*3/uL (ref 4.0–10.5)
WBC: 7.7 10*3/uL (ref 4.0–10.5)
nRBC: 0 % (ref 0.0–0.2)
nRBC: 0 % (ref 0.0–0.2)

## 2019-05-12 LAB — GLUCOSE, CAPILLARY
Glucose-Capillary: 150 mg/dL — ABNORMAL HIGH (ref 70–99)
Glucose-Capillary: 165 mg/dL — ABNORMAL HIGH (ref 70–99)

## 2019-05-12 LAB — TROPONIN I (HIGH SENSITIVITY): Troponin I (High Sensitivity): 7 ng/L (ref <18)

## 2019-05-12 MED ORDER — INFLUENZA VAC A&B SA ADJ QUAD 0.5 ML IM PRSY
0.5000 mL | PREFILLED_SYRINGE | INTRAMUSCULAR | Status: AC
  Administered 2019-05-12: 0.5 mL via INTRAMUSCULAR
  Filled 2019-05-12: qty 0.5

## 2019-05-12 MED ORDER — SODIUM CHLORIDE 0.9% FLUSH
3.0000 mL | Freq: Once | INTRAVENOUS | Status: AC
  Administered 2019-05-13: 3 mL via INTRAVENOUS

## 2019-05-12 MED ORDER — DEXTROSE-NACL 5-0.9 % IV SOLN
INTRAVENOUS | Status: DC
  Administered 2019-05-12: 09:00:00 via INTRAVENOUS

## 2019-05-12 MED ORDER — ALBUTEROL SULFATE (2.5 MG/3ML) 0.083% IN NEBU
2.5000 mg | INHALATION_SOLUTION | Freq: Once | RESPIRATORY_TRACT | Status: AC
  Administered 2019-05-12: 2.5 mg via RESPIRATORY_TRACT
  Filled 2019-05-12: qty 3

## 2019-05-12 MED ORDER — SODIUM CHLORIDE 0.9 % IV SOLN
0.5000 g | Freq: Once | INTRAVENOUS | Status: DC
  Filled 2019-05-12: qty 5

## 2019-05-12 MED ORDER — TRAMADOL HCL 50 MG PO TABS: 50.0000 mg | ORAL_TABLET | Freq: Four times a day (QID) | ORAL | 0 refills | Status: DC | PRN

## 2019-05-12 MED ORDER — INSULIN ASPART 100 UNIT/ML ~~LOC~~ SOLN
0.0000 [IU] | Freq: Three times a day (TID) | SUBCUTANEOUS | Status: DC
  Administered 2019-05-12: 3 [IU] via SUBCUTANEOUS

## 2019-05-12 MED ORDER — CALCIUM GLUCONATE-NACL 1-0.675 GM/50ML-% IV SOLN
1.0000 g | Freq: Once | INTRAVENOUS | Status: DC
  Filled 2019-05-12: qty 50

## 2019-05-12 MED ORDER — SODIUM POLYSTYRENE SULFONATE 15 GM/60ML PO SUSP
30.0000 g | Freq: Once | ORAL | Status: AC
  Administered 2019-05-12: 30 g via ORAL
  Filled 2019-05-12: qty 120

## 2019-05-12 MED ORDER — PNEUMOCOCCAL VAC POLYVALENT 25 MCG/0.5ML IJ INJ
0.5000 mL | INJECTION | INTRAMUSCULAR | Status: AC
  Administered 2019-05-12: 0.5 mL via INTRAMUSCULAR

## 2019-05-12 NOTE — Progress Notes (Signed)
Patient in a stable condition discharge education reviewed with patient she verbalized understating, iv removed, tele dc ccmd notified, patient belongings at bedside, patient to be transported home by her husband.

## 2019-05-12 NOTE — Progress Notes (Addendum)
  Progress Note    05/12/2019 7:32 AM 1 Day Post-Op  Subjective:  Denies stroke symptoms including further R sided weakness, slurring speech, or changes in vision   Vitals:   05/11/19 2048 05/12/19 0518  BP: (!) 114/54 (!) 123/56  Pulse: 65 64  Resp: 20 17  Temp: 98.5 F (36.9 C) 97.9 F (36.6 C)  SpO2: 90% 100%   Physical Exam: Lungs:  Non labored Incisions:  L neck incision soft, without hematoma Extremities:  Moving all extremities well Neurologic: A&O  CBC    Component Value Date/Time   WBC 7.0 05/12/2019 0500   RBC 2.63 (L) 05/12/2019 0500   HGB 8.0 (L) 05/12/2019 0500   HCT 25.9 (L) 05/12/2019 0500   PLT 147 (L) 05/12/2019 0500   MCV 98.5 05/12/2019 0500   MCH 30.4 05/12/2019 0500   MCHC 30.9 05/12/2019 0500   RDW 13.1 05/12/2019 0500    BMET    Component Value Date/Time   NA 139 05/12/2019 0500   K 6.2 (H) 05/12/2019 0500   CL 104 05/12/2019 0500   CO2 25 05/12/2019 0500   GLUCOSE 132 (H) 05/12/2019 0500   BUN 30 (H) 05/12/2019 0500   CREATININE 1.72 (H) 05/12/2019 0500   CALCIUM 6.8 (L) 05/12/2019 0500   GFRNONAA 29 (L) 05/12/2019 0500   GFRAA 33 (L) 05/12/2019 0500    INR    Component Value Date/Time   INR 1.0 05/08/2019 1540     Intake/Output Summary (Last 24 hours) at 05/12/2019 0732 Last data filed at 05/12/2019 0048 Gross per 24 hour  Intake 1270 ml  Output 100 ml  Net 1170 ml     Assessment/Plan:  73 y.o. female is s/p L CEA 1 Day Post-Op   Neuro exam remains at baseline Hyperkalemic; likely hemolysis from surgery; give kayexolate, insulin and slow d5, albuterol neb treatment; recheck K this afternoon prior to d/c home Will hold off on calcium given CKD unless K remains elevated after the above Follow up with Dr. Donzetta Matters in 2 weeks   Dagoberto Ligas, PA-C [facility] [phone] 05/12/2019 7:32 AM  Agree with above. Doing ok from CeA standpoint HOme if potassium check is ok  Ruta Hinds, MD  [facility] [place] Office: [phone]

## 2019-05-12 NOTE — Plan of Care (Signed)
  Problem: Clinical Measurements: Goal: Will remain free from infection Outcome: Progressing   Problem: Activity: Goal: Risk for activity intolerance will decrease Outcome: Progressing   

## 2019-05-12 NOTE — Progress Notes (Signed)
SATURATION QUALIFICATIONS: (This note is used to comply with regulatory documentation for home oxygen)  Patient Saturations on Room Air at Rest = 87%  Patient Saturations on Room Air while Ambulating = 84%  Patient Saturations on 2 Liters of oxygen while Ambulating = 96%  Please briefly explain why patient needs home oxygen:patient need home oxygen to maintain adequate tissue oxygenation.

## 2019-05-12 NOTE — ED Triage Notes (Signed)
Pt had left sided endarterectomy yesterday. Pt reports today she said she didn't feel right, sob and her chest got real hot and bulging. Chest pain & sob with exertion. Pt reports her chest became blue but has decreased some since she has relaxed some. Pt chest red, swelling and warm to touch. Also a heart cath last Thursday. Pt reports decreased urination since the cath.   Pt Spo2 86% in triage. Pt placed on 2L O2.

## 2019-05-12 NOTE — ED Notes (Signed)
Pt Spo2 up to 98% on 2L of O2.

## 2019-05-12 NOTE — ED Notes (Signed)
Pt's husband Chrissie Noa, [phone].

## 2019-05-12 NOTE — Plan of Care (Signed)

## 2019-05-13 ENCOUNTER — Emergency Department (HOSPITAL_COMMUNITY): Payer: Medicare Other

## 2019-05-13 ENCOUNTER — Observation Stay (HOSPITAL_COMMUNITY): Payer: Medicare Other

## 2019-05-13 ENCOUNTER — Other Ambulatory Visit: Payer: Self-pay

## 2019-05-13 ENCOUNTER — Encounter (HOSPITAL_COMMUNITY): Payer: Self-pay | Admitting: Internal Medicine

## 2019-05-13 DIAGNOSIS — J9601 Acute respiratory failure with hypoxia: Secondary | ICD-10-CM | POA: Diagnosis present

## 2019-05-13 DIAGNOSIS — R338 Other retention of urine: Secondary | ICD-10-CM

## 2019-05-13 DIAGNOSIS — N9989 Other postprocedural complications and disorders of genitourinary system: Secondary | ICD-10-CM

## 2019-05-13 DIAGNOSIS — N184 Chronic kidney disease, stage 4 (severe): Secondary | ICD-10-CM | POA: Diagnosis present

## 2019-05-13 LAB — TROPONIN I (HIGH SENSITIVITY): Troponin I (High Sensitivity): 9 ng/L (ref <18)

## 2019-05-13 LAB — SARS CORONAVIRUS 2 (TAT 6-24 HRS): SARS Coronavirus 2: NEGATIVE

## 2019-05-13 LAB — BRAIN NATRIURETIC PEPTIDE: B Natriuretic Peptide: 74.3 pg/mL (ref 0.0–100.0)

## 2019-05-13 LAB — GLUCOSE, CAPILLARY
Glucose-Capillary: 193 mg/dL — ABNORMAL HIGH (ref 70–99)
Glucose-Capillary: 113 mg/dL — ABNORMAL HIGH (ref 70–99)

## 2019-05-13 MED ORDER — HYDRALAZINE HCL 10 MG PO TABS
10.0000 mg | ORAL_TABLET | Freq: Two times a day (BID) | ORAL | Status: DC
  Administered 2019-05-13 – 2019-05-16 (×7): 10 mg via ORAL
  Filled 2019-05-13 (×8): qty 1

## 2019-05-13 MED ORDER — MOMETASONE FURO-FORMOTEROL FUM 200-5 MCG/ACT IN AERO
2.0000 | INHALATION_SPRAY | Freq: Two times a day (BID) | RESPIRATORY_TRACT | Status: DC
  Administered 2019-05-14 – 2019-05-16 (×5): 2 via RESPIRATORY_TRACT
  Filled 2019-05-13 (×2): qty 8.8

## 2019-05-13 MED ORDER — COLESEVELAM HCL 625 MG PO TABS
1875.0000 mg | ORAL_TABLET | Freq: Two times a day (BID) | ORAL | Status: DC
  Administered 2019-05-13 – 2019-05-16 (×7): 1875 mg via ORAL
  Filled 2019-05-13 (×9): qty 3

## 2019-05-13 MED ORDER — TAMSULOSIN HCL 0.4 MG PO CAPS
0.4000 mg | ORAL_CAPSULE | Freq: Every day | ORAL | Status: DC
  Administered 2019-05-13 – 2019-05-16 (×4): 0.4 mg via ORAL
  Filled 2019-05-13 (×4): qty 1

## 2019-05-13 MED ORDER — ALBUTEROL SULFATE HFA 108 (90 BASE) MCG/ACT IN AERS
2.0000 | INHALATION_SPRAY | RESPIRATORY_TRACT | Status: DC | PRN
  Filled 2019-05-13: qty 6.7

## 2019-05-13 MED ORDER — INSULIN ASPART 100 UNIT/ML ~~LOC~~ SOLN
0.0000 [IU] | Freq: Three times a day (TID) | SUBCUTANEOUS | Status: DC
  Administered 2019-05-13 – 2019-05-14 (×3): 2 [IU] via SUBCUTANEOUS
  Administered 2019-05-15: 1 [IU] via SUBCUTANEOUS
  Administered 2019-05-15 (×2): 3 [IU] via SUBCUTANEOUS
  Administered 2019-05-16: 2 [IU] via SUBCUTANEOUS

## 2019-05-13 MED ORDER — DOCUSATE SODIUM 100 MG PO CAPS
100.0000 mg | ORAL_CAPSULE | Freq: Two times a day (BID) | ORAL | Status: DC
  Administered 2019-05-13 – 2019-05-16 (×7): 100 mg via ORAL
  Filled 2019-05-13 (×7): qty 1

## 2019-05-13 MED ORDER — CAMPHOR-MENTHOL 0.5-0.5 % EX LOTN
1.0000 "application " | TOPICAL_LOTION | Freq: Three times a day (TID) | CUTANEOUS | Status: DC | PRN
  Filled 2019-05-13: qty 222

## 2019-05-13 MED ORDER — ALBUTEROL SULFATE HFA 108 (90 BASE) MCG/ACT IN AERS: 8.0000 | INHALATION_SPRAY | RESPIRATORY_TRACT | Status: DC | PRN

## 2019-05-13 MED ORDER — TECHNETIUM TO 99M ALBUMIN AGGREGATED
1.5600 | Freq: Once | INTRAVENOUS | Status: AC | PRN
  Administered 2019-05-13: 1.56 via INTRAVENOUS

## 2019-05-13 MED ORDER — MIRTAZAPINE 15 MG PO TABS
30.0000 mg | ORAL_TABLET | Freq: Every day | ORAL | Status: DC
  Administered 2019-05-13 – 2019-05-15 (×3): 30 mg via ORAL
  Filled 2019-05-13 (×3): qty 2
  Filled 2019-05-13: qty 1

## 2019-05-13 MED ORDER — GUAIFENESIN-DM 100-10 MG/5ML PO SYRP
5.0000 mL | ORAL_SOLUTION | ORAL | Status: DC | PRN
  Administered 2019-05-13: 5 mL via ORAL
  Filled 2019-05-13: qty 5

## 2019-05-13 MED ORDER — MAGNESIUM OXIDE 400 (241.3 MG) MG PO TABS
400.0000 mg | ORAL_TABLET | Freq: Two times a day (BID) | ORAL | Status: DC
  Administered 2019-05-13 – 2019-05-16 (×7): 400 mg via ORAL
  Filled 2019-05-13 (×7): qty 1

## 2019-05-13 MED ORDER — ONDANSETRON HCL 4 MG PO TABS: 4.0000 mg | ORAL_TABLET | Freq: Four times a day (QID) | ORAL | Status: DC | PRN

## 2019-05-13 MED ORDER — ACETAMINOPHEN 650 MG RE SUPP: 650.0000 mg | Freq: Four times a day (QID) | RECTAL | Status: DC | PRN

## 2019-05-13 MED ORDER — CALCIUM CARBONATE ANTACID 1250 MG/5ML PO SUSP
500.0000 mg | Freq: Four times a day (QID) | ORAL | Status: DC | PRN
  Filled 2019-05-13: qty 5

## 2019-05-13 MED ORDER — SORBITOL 70 % SOLN
30.0000 mL | Status: DC | PRN
  Filled 2019-05-13: qty 30

## 2019-05-13 MED ORDER — NEPRO/CARBSTEADY PO LIQD
237.0000 mL | Freq: Three times a day (TID) | ORAL | Status: DC | PRN
  Filled 2019-05-13: qty 237

## 2019-05-13 MED ORDER — PRAMIPEXOLE DIHYDROCHLORIDE 0.125 MG PO TABS
0.1250 mg | ORAL_TABLET | Freq: Two times a day (BID) | ORAL | Status: DC | PRN
  Administered 2019-05-14 – 2019-05-16 (×2): 0.125 mg via ORAL
  Filled 2019-05-13 (×3): qty 1

## 2019-05-13 MED ORDER — HYDROXYZINE HCL 25 MG PO TABS: 25.0000 mg | ORAL_TABLET | Freq: Three times a day (TID) | ORAL | Status: DC | PRN

## 2019-05-13 MED ORDER — ACETAMINOPHEN 325 MG PO TABS: 650.0000 mg | ORAL_TABLET | Freq: Four times a day (QID) | ORAL | Status: DC | PRN

## 2019-05-13 MED ORDER — ALBUTEROL SULFATE HFA 108 (90 BASE) MCG/ACT IN AERS
8.0000 | INHALATION_SPRAY | RESPIRATORY_TRACT | Status: AC
  Administered 2019-05-13: 8 via RESPIRATORY_TRACT
  Filled 2019-05-13: qty 6.7

## 2019-05-13 MED ORDER — DICYCLOMINE HCL 20 MG PO TABS
20.0000 mg | ORAL_TABLET | Freq: Every day | ORAL | Status: DC | PRN
  Filled 2019-05-13: qty 1

## 2019-05-13 MED ORDER — ZOLPIDEM TARTRATE 5 MG PO TABS: 5.0000 mg | ORAL_TABLET | Freq: Every evening | ORAL | Status: DC | PRN

## 2019-05-13 MED ORDER — INSULIN ASPART 100 UNIT/ML ~~LOC~~ SOLN: 0.0000 [IU] | Freq: Every day | SUBCUTANEOUS | Status: DC

## 2019-05-13 MED ORDER — DOCUSATE SODIUM 283 MG RE ENEM
1.0000 | ENEMA | RECTAL | Status: DC | PRN
  Filled 2019-05-13: qty 1

## 2019-05-13 MED ORDER — METHADONE HCL 10 MG PO TABS
5.0000 mg | ORAL_TABLET | Freq: Three times a day (TID) | ORAL | Status: DC
  Administered 2019-05-13 – 2019-05-16 (×10): 5 mg via ORAL
  Filled 2019-05-13 (×10): qty 1

## 2019-05-13 MED ORDER — ENOXAPARIN SODIUM 30 MG/0.3ML ~~LOC~~ SOLN
30.0000 mg | SUBCUTANEOUS | Status: DC
  Administered 2019-05-13 – 2019-05-16 (×4): 30 mg via SUBCUTANEOUS
  Filled 2019-05-13 (×4): qty 0.3

## 2019-05-13 MED ORDER — ONDANSETRON HCL 4 MG/2ML IJ SOLN: 4.0000 mg | Freq: Four times a day (QID) | INTRAMUSCULAR | Status: DC | PRN

## 2019-05-13 MED ORDER — FLUTICASONE PROPIONATE 50 MCG/ACT NA SUSP
2.0000 | Freq: Every day | NASAL | Status: DC
  Administered 2019-05-13 – 2019-05-16 (×4): 2 via NASAL
  Filled 2019-05-13 (×2): qty 16

## 2019-05-13 MED ORDER — ASPIRIN EC 81 MG PO TBEC
81.0000 mg | DELAYED_RELEASE_TABLET | Freq: Every day | ORAL | Status: DC
  Administered 2019-05-13 – 2019-05-16 (×4): 81 mg via ORAL
  Filled 2019-05-13 (×4): qty 1

## 2019-05-13 NOTE — Plan of Care (Signed)
  Problem: Education: Goal: Knowledge of General Education information will improve Description: Including pain rating scale, medication(s)/side effects and non-pharmacologic comfort measures Outcome: Progressing   Problem: Health Behavior/Discharge Planning: Goal: Ability to manage health-related needs will improve Outcome: Progressing   Problem: Clinical Measurements: Goal: Diagnostic test results will improve Outcome: Progressing Goal: Cardiovascular complication will be avoided Outcome: Progressing   Problem: Activity: Goal: Risk for activity intolerance will decrease Outcome: Progressing   Problem: Nutrition: Goal: Adequate nutrition will be maintained Outcome: Progressing   Problem: Pain Managment: Goal: General experience of comfort will improve Outcome: Progressing   Problem: Safety: Goal: Ability to remain free from injury will improve Outcome: Progressing   Problem: Skin Integrity: Goal: Risk for impaired skin integrity will decrease Outcome: Progressing

## 2019-05-13 NOTE — ED Notes (Signed)
Bladder scan showed >691 mL

## 2019-05-13 NOTE — ED Notes (Signed)
Patient returned from nuclear medicine with a wet bed after urinating on the stretcher. Patient stated there was no one to help her to the restroom and her pure wick was not attached to suction. New sheets and gown were provider and the patient was cleaned.

## 2019-05-13 NOTE — ED Notes (Signed)
Provider at bedside

## 2019-05-13 NOTE — Progress Notes (Signed)
Pt arrived to room 6N19 via stretcher from the ED. Received report from [place], Therapist, sports. See assessment. Will continue to monitor.

## 2019-05-13 NOTE — Plan of Care (Signed)

## 2019-05-13 NOTE — ED Notes (Signed)
Patient transported to nuclear medicine 

## 2019-05-13 NOTE — Progress Notes (Signed)
73 year old female status post left carotid endarterectomy on 05/11/2019 by Dr. Donzetta Matters for an asymptomatic high-grade stenosis.  She was discharged yesterday with no significant postop issues on the floor.  She presented back to the ED today with some shortness of breath at home with wheezing as well as unable to void and having difficulty emptying her bladder.  Her left neck incision looks clean dry and intact with no hematoma.  Her cranial nerves all appear intact.  There have been no new neurologic deficits.  Appreciate Dr. Lorin Mercy assistance for postoperative urinary retention and albuterol and other treatments for underlying COPD and asthma.  She does have some redness on her chest wall but this does not appear to be cellulitis and may be a reaction from the prep in the OR.  Vascular certainly available if questions or concerns arise.  Will be placed in observation overnight.  Marty Heck, MD [facility] [place] Office: [phone] Pager: [phone]

## 2019-05-13 NOTE — ED Provider Notes (Signed)
[facility] EMERGENCY DEPARTMENT Provider Note   CSN: [id number] Arrival date & time: 05/12/19  2058     History   Chief Complaint Chief Complaint  Patient presents with   Shortness of Breath   Post-op Problem    HPI Danielle Fuller is a 73 y.o. female.     Patient presents to the emergency department for evaluation of shortness of breath.  Patient underwent heart catheterization last week.  She reports that she has been noticing decreased urination and also has had a 13 pound weight gain since the catheterization.  She is worried that her kidneys are not working.  Patient had carotid endarterectomy yesterday.  She has been experiencing shortness of breath today.  She has noticed that the upper chest area has been discolored and swollen.  There is associated pain in this region.     Past Medical History:  Diagnosis Date   Anemia    Asthma    Diabetes mellitus without complication (HCC)    Diastolic dysfunction    DOE (dyspnea on exertion)    Fibromyalgia    GERD (gastroesophageal reflux disease)    Heart murmur    Hypertension    Mixed hyperlipidemia    Neuropathy    RSD (reflex sympathetic dystrophy)    [facility] started on Methadone in 1997   Sleep apnea    doesn't wear CPAP    Patient Active Problem List   Diagnosis Date Noted   Acute respiratory failure with hypoxia ([place]) 05/13/2019   Carotid artery stenosis 05/11/2019   Type 2 diabetes mellitus without complication ([place]) 78/67/6720   Diastolic dysfunction 94/70/9628   Asthma 36/62/9476   Acute metabolic encephalopathy 54/65/0354   RSD (reflex sympathetic dystrophy) 02/27/2017   Fibromyalgia 02/27/2017    Past Surgical History:  Procedure Laterality Date   ABDOMINAL HYSTERECTOMY     ANKLE FRACTURE SURGERY     BACK SURGERY     CARDIAC CATHETERIZATION  04/27/2019   [place]   CARPAL TUNNEL RELEASE     CATARACT EXTRACTION, BILATERAL Bilateral    COLONOSCOPY      ENDARTERECTOMY Left 05/11/2019   Procedure: ENDARTERECTOMY CAROTID;  Surgeon: Waynetta Sandy, MD;  Location: [facility];  Service: Vascular;  Laterality: Left;   FEMUR FRACTURE SURGERY Left 2018   rod and 5 implants   FRACTURE SURGERY     IR RADIOLOGIST EVAL & MGMT  01/07/2017   IR RADIOLOGIST EVAL & MGMT  02/02/2017   IR VERTEBROPLASTY CERV/THOR BX INC UNI/BIL INC/INJECT/IMAGING  01/14/2017   IR VERTEBROPLASTY CERV/THOR BX INC UNI/BIL INC/INJECT/IMAGING  03/01/2017   IR VERTEBROPLASTY EA ADDL (T&LS) BX INC UNI/BIL INC INJECT/IMAGING  01/14/2017   KNEE ARTHROSCOPY       OB History   No obstetric history on file.      Home Medications    Prior to Admission medications   Medication Sig Start Date End Date Taking? Authorizing Provider  ADVAIR DISKUS 500-50 MCG/DOSE AEPB Inhale 1 puff into the lungs 2 (two) times daily. 11/08/18   [provider]  aspirin EC 81 MG tablet Take 81 mg by mouth daily.    [provider]  baclofen (LIORESAL) 20 MG tablet Take 20 mg by mouth 3 (three) times daily as needed for muscle spasms.    [provider]  colesevelam (WELCHOL) 625 MG tablet Take 1,875 mg by mouth 2 (two) times daily with a meal.    [provider]  Cyanocobalamin (B-12 COMPLIANCE INJECTION) 1000  MCG/ML KIT Inject 1 mL as directed every 30 (thirty) days.    [provider]  diclofenac sodium (VOLTAREN) 1 % GEL Apply 2 g topically 4 (four) times daily. Patient taking differently: Apply 2 g topically 3 (three) times daily as needed (pain).  02/27/17   Debbe Odea, MD  dicyclomine (BENTYL) 20 MG tablet Take 20 mg by mouth 3 (three) times daily before meals.    [provider]  fluticasone (FLONASE) 50 MCG/ACT nasal spray Place 2 sprays into both nostrils daily.    [provider]  FORTEO 600 MCG/2.4ML SOPN Inject 20 mcg into the muscle at bedtime.  02/20/19   [provider]  hydrALAZINE (APRESOLINE) 10 MG  tablet Take 10 mg by mouth 2 (two) times daily. 04/03/19   [provider]  magnesium oxide (MAG-OX) 400 MG tablet Take 400 mg by mouth 2 (two) times daily.    [provider]  metFORMIN (GLUCOPHAGE-XR) 500 MG 24 hr tablet Take 500 mg by mouth daily as needed (High blood sugar).     [provider]  methadone (DOLOPHINE) 5 MG tablet Take 5 mg by mouth 5 (five) times daily.    [provider]  mirtazapine (REMERON) 30 MG tablet Take 30 mg by mouth at bedtime.    [provider]  Multiple Vitamins-Minerals (PRESERVISION AREDS 2+MULTI VIT PO) Take 1 capsule by mouth 2 (two) times daily.    [provider]  pramipexole (MIRAPEX) 0.125 MG tablet Take 0.125 mg by mouth 2 (two) times daily as needed. 03/24/19   [provider]  promethazine (PHENERGAN) 25 MG tablet Take 25 mg by mouth 3 (three) times daily as needed for nausea or vomiting.    [provider]  traMADol (ULTRAM) 50 MG tablet Take 1 tablet (50 mg total) by mouth every 6 (six) hours as needed for moderate pain. 05/12/19   Dagoberto Ligas, PA-C    Family History Family History  Problem Relation Age of Onset   Stroke Mother    Heart disease Father     Social History Social History   Tobacco Use   Smoking status: Former Smoker    Types: Cigarettes   Smokeless tobacco: Never Used  Substance Use Topics   Alcohol use: No   Drug use: No     Allergies   Clarithromycin, Levofloxacin, and Moxifloxacin   Review of Systems Review of Systems  Respiratory: Positive for shortness of breath.   Cardiovascular: Positive for chest pain.  Genitourinary: Positive for decreased urine volume.  Skin: Positive for color change.  All other systems reviewed and are negative.    Physical Exam Updated Vital Signs BP (!) 113/47    Pulse 66    Temp 99.2 F (37.3 C) (Oral)    Resp 20    Ht 5' (1.524 m)    Wt 47.2 kg    SpO2 100%    BMI 20.31 kg/m   Physical  Exam Vitals signs and nursing note reviewed.  Constitutional:      General: She is not in acute distress.    Appearance: Normal appearance. She is well-developed.  HENT:     Head: Normocephalic and atraumatic.     Right Ear: Hearing normal.     Left Ear: Hearing normal.     Nose: Nose normal.  Eyes:     Conjunctiva/sclera: Conjunctivae normal.     Pupils: Pupils are equal, round, and reactive to light.  Neck:     Musculoskeletal: Normal range  of motion and neck supple.  Cardiovascular:     Rate and Rhythm: Regular rhythm.     Heart sounds: S1 normal and S2 normal. No murmur. No friction rub. No gallop.   Pulmonary:     Effort: Tachypnea and accessory muscle usage present.     Breath sounds: Decreased breath sounds and wheezing present.  Chest:     Chest wall: No tenderness.  Abdominal:     General: Bowel sounds are normal.     Palpations: Abdomen is soft.     Tenderness: There is no abdominal tenderness. There is no guarding or rebound. Negative signs include Murphy's sign and McBurney's sign.     Hernia: No hernia is present.  Musculoskeletal: Normal range of motion.  Skin:    General: Skin is warm and dry.     Comments: Carotid endarterectomy site intact, surrounding mild swelling and erythema to the upper central chest  Neurological:     Mental Status: She is alert and oriented to person, place, and time.     GCS: GCS eye subscore is 4. GCS verbal subscore is 5. GCS motor subscore is 6.     Cranial Nerves: No cranial nerve deficit.     Sensory: No sensory deficit.     Coordination: Coordination normal.  Psychiatric:        Speech: Speech normal.        Behavior: Behavior normal.        Thought Content: Thought content normal.      ED Treatments / Results  Labs (all labs ordered are listed, but only abnormal results are displayed) Labs Reviewed  BASIC METABOLIC PANEL - Abnormal; Notable for the following components:      Result Value   Glucose, Bld 150 (*)     BUN 29 (*)    Creatinine, Ser 1.76 (*)    Calcium 8.5 (*)    GFR calc non Af Amer 28 (*)    GFR calc Af Amer 32 (*)    All other components within normal limits  CBC - Abnormal; Notable for the following components:   RBC 2.84 (*)    Hemoglobin 8.9 (*)    HCT 28.2 (*)    All other components within normal limits  SARS CORONAVIRUS 2 (TAT 6-24 HRS)  BRAIN NATRIURETIC PEPTIDE  TROPONIN I (HIGH SENSITIVITY)  TROPONIN I (HIGH SENSITIVITY)    EKG EKG Interpretation  Date/Time:  Friday May 12 2019 21:27:52 EDT Ventricular Rate:  73 PR Interval:  118 QRS Duration: 80 QT Interval:  382 QTC Calculation: 420 R Axis:   56 Text Interpretation:  Normal sinus rhythm Normal ECG Confirmed by Orpah Greek [phone]) on 05/13/2019 5:01:34 AM   Radiology Dg Chest 2 View  Result Date: 05/12/2019 CLINICAL DATA:  Shortness of breath EXAM: CHEST - 2 VIEW COMPARISON:  February 13, 2019 FINDINGS: There is atelectatic change in the left base with small left pleural effusion. The lungs elsewhere are clear. The heart is upper normal in size with pulmonary vascularity normal. There are multiple compression fractures throughout the thoracic and upper lumbar spine. Patient is undergone several midthoracic kyphoplasty procedures. There is increase in kyphosis with osteoporosis. Old healed rib fractures on the left noted. IMPRESSION: Atelectatic change in left base with small left pleural effusion. Lungs elsewhere clear. Heart upper normal in size. Bones osteoporotic with multiple thoracic compression fractures and evidence of kyphoplasty procedures at several levels. Old healed rib fractures on the left noted. Electronically Signed  By: Lowella Grip III M.D.   On: 05/12/2019 22:01   Ct Soft Tissue Neck Wo Contrast  Result Date: 05/13/2019 CLINICAL DATA:  Initial evaluation for acute shortness of breath, recent carotid endarterectomy. EXAM: CT NECK WITHOUT CONTRAST TECHNIQUE: Multidetector CT  imaging of the neck was performed following the standard protocol without intravenous contrast. COMPARISON:  Prior study from 04/06/2019. FINDINGS: Pharynx and larynx: Oral cavity within normal limits without discrete mass or collection. No acute abnormality about the remaining dentition. Oropharynx and nasopharynx within normal limits. Parapharyngeal fat maintained. Small layering retropharyngeal effusion, likely postoperative related to recent endarterectomy. Epiglottis normal. Vallecula clear. Remainder of the hypopharynx and supraglottic larynx within normal limits. True cords symmetric and normal. Subglottic airway clear. Salivary glands: Salivary glands including the parotid and submandibular glands within normal limits. Thyroid: Subcentimeter hypodensity noted at the lower pole the left thyroid, of doubtful significance given size and patient age. Subcentimeter calcification noted within the lower pole the right thyroid. Thyroid otherwise unremarkable. Lymph nodes: No pathologically enlarged lymph nodes seen within the neck. Vascular: Moderate atherosclerotic change about the aortic arch. Postoperative changes from recent left carotid endarterectomy are seen, with multiple surgical clips about the left bifurcation. Adjacent soft tissue emphysema with stranding and swelling within the left neck, consistent with postoperative changes. No frank hematoma or other complication identified on this noncontrast examination. Bulky calcified plaque noted about the right bifurcation. Additional scattered atherosclerotic change noted within the carotid siphons. Limited intracranial: Unremarkable. Visualized orbits: Patient status post bilateral ocular lens replacement. Globes orbital soft tissues demonstrate no acute finding. Mastoids and visualized paranasal sinuses: Minimal mucosal thickening noted within the right maxillary sinus. Visualized paranasal sinuses are otherwise clear. Mastoid air cells and middle ear  cavities are well pneumatized and free of fluid. Skeleton: No acute osseous abnormality. No discrete lytic or blastic osseous lesions. Multiple chronic compression deformities noted within the upper thoracic spine, several of which demonstrates sequelae of prior vertebral augmentation. Secondary exaggeration of the normal upper thoracic kyphosis. Moderate cervical spondylosis noted at C5-6. Upper chest: Small layering bilateral pleural effusions partially visualized. Visualized lungs are otherwise grossly clear. Visualized upper chest demonstrates no other acute finding. Other: None. IMPRESSION: 1. Postoperative changes from recent left carotid endarterectomy with expected postoperative changes within the adjacent left neck. No discrete hematoma or other complication identified on this noncontrast examination. 2. Small layering bilateral pleural effusions. 3. Multiple old compression fractures within the visualized upper thoracic spine, several of which demonstrate changes of prior vertebral augmentation. Electronically Signed   By: Jeannine Boga M.D.   On: 05/13/2019 06:43    Procedures Procedures (including critical care time)  Medications Ordered in ED Medications  sodium chloride flush (NS) 0.9 % injection 3 mL (3 mLs Intravenous Given 05/13/19 0514)  albuterol (VENTOLIN HFA) 108 (90 Base) MCG/ACT inhaler 8 puff (8 puffs Inhalation Given 05/13/19 0513)     Initial Impression / Assessment and Plan / ED Course  I have reviewed the triage vital signs and the nursing notes.  Pertinent labs & imaging results that were available during my care of the patient were reviewed by me and considered in my medical decision making (see chart for details).       Patient presents to the emergency department for evaluation of shortness of breath.  Patient had cardiac catheterization 1 week ago.  She then had carotid endarterectomy 2 days ago.  Patient reports that she has noticed decreased urine output  since the cardiac catheterization.  Review  her records does reveal elevation in her creatinine since cardiac cath.  She does not, however, appear to be volume overloaded.  Chest x-ray does not show pulmonary edema, BNP is less than 100.  Patient presents with significant wheezing at arrival.  She does not have any stridor.  She does not have a history of asthma or COPD, but does use Advair at home.  She therefore likely has some element of chronic lung disease.  She reports that she has a history of recurrent "bronchitis".  Patient exhibiting borderline hypoxia on room air at rest and had significant desaturations with minimal movement.  She is doing much better on nasal cannula oxygen.  She was administered albuterol and wheezing has significantly improved and her breathing is much more comfortable.  Cannot rule out PE although feel that it is very unlikely.  Cannot perform CT angiography because of renal insufficiency.  Doubt utility of D-dimer 2 days postoperative, will perform VQ scan.  Patient does not have any stridor.  She is not having difficulty swallowing.  CT soft neck without contrast performed.  There are postoperative changes seen but no evidence of hematoma or, infection.  Patient has some discoloration of the left side of her neck and upper chest.  This is extremely well demarcated and slightly swollen, does not resemble cellulitis.  Etiology is unclear, possibly reaction to cleaning substance prior to carotid endarterectomy.  Final Clinical Impressions(s) / ED Diagnoses   Final diagnoses:  Acute respiratory failure with hypoxia [facility])    ED Discharge Orders    None       Orpah Greek, MD 05/13/19 [phone]

## 2019-05-13 NOTE — H&P (Signed)
History and Physical    Danielle Fuller NHA:[id number] DOB: 01-Jul-1946 DOA: 05/12/2019  PCP: Ernestene Kiel, MD Consultants:  Donzetta Matters - vascular; [place] - cardiology Patient coming from:  Home - lives with husband; NOK: Husband, [phone]  Chief Complaint: wheezing  HPI: Danielle Fuller is a 73 y.o. 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  She had L CEA on 10/22.  She stayed overnight and her husband took her home yesterday.  She ate and got sick and felt unable to walk.  Her chest was red and swollen.  It looked like her chest "had air in it or something,"  She was feeling SOB with wheezing and this is a little better now.  She just "got so sick", she couldn't breathe.  No n/v.  She still feels SOB.  She was also having difficulty voiding and has felt unable to empty her bladder.  She underwent L CEA on 10/22 with Dr. Donzetta Matters.  She had post-operative hyperkalemia that was treated prior to d/c.    ED Course:  Carryover, per Dr. Hal Hope:  73 year old female with history of diabetes who had a recent carotid endarterectomy 2 days ago presents with wheezing and shortness of breath. ER physician feels patient symptoms are consistent with bronchitis. CT soft neck tissue official read is still pending but per ER physician did not show any hematoma. VQ scan is pending. Patient admitted for possible acute bronchitis.  Review of Systems: As per HPI; otherwise review of systems reviewed and negative.   Ambulatory Status:  Ambulates without assistance  Past Medical History:  Diagnosis Date  . Anemia   . Asthma   . Carotid stenosis, left    CEA 05/11/19  . Diabetes mellitus without complication ([place])   . Diastolic dysfunction   . DOE (dyspnea on exertion)   . Fibromyalgia   . GERD (gastroesophageal reflux disease)   . Heart murmur   . Hypertension   . Mixed  hyperlipidemia   . Neuropathy   . RSD (reflex sympathetic dystrophy)    [facility] started on Methadone in 1997  . Sleep apnea    doesn't wear CPAP    Past Surgical History:  Procedure Laterality Date  . ABDOMINAL HYSTERECTOMY    . ANKLE FRACTURE SURGERY    . BACK SURGERY    . CARDIAC CATHETERIZATION  04/27/2019   [place]  . CARPAL TUNNEL RELEASE    . CATARACT EXTRACTION, BILATERAL Bilateral   . COLONOSCOPY    . ENDARTERECTOMY Left 05/11/2019   Procedure: ENDARTERECTOMY CAROTID;  Surgeon: Waynetta Sandy, MD;  Location: [facility];  Service: Vascular;  Laterality: Left;  . FEMUR FRACTURE SURGERY Left 2018   rod and 5 implants  . FRACTURE SURGERY    . IR RADIOLOGIST EVAL & MGMT  01/07/2017  . IR RADIOLOGIST EVAL & MGMT  02/02/2017  . IR VERTEBROPLASTY CERV/THOR BX INC UNI/BIL INC/INJECT/IMAGING  01/14/2017  . IR VERTEBROPLASTY CERV/THOR BX INC UNI/BIL INC/INJECT/IMAGING  03/01/2017  . IR VERTEBROPLASTY EA ADDL (T&LS) BX INC UNI/BIL INC INJECT/IMAGING  01/14/2017  . KNEE ARTHROSCOPY      Social History   Socioeconomic History  . Marital status: Married    Spouse name: Not on file  . Number of children: Not on file  . Years of education: Not on file  . Highest education level: Not on file  Occupational History  . Not on file  Social Needs  . Financial resource strain: Not on file  . Food insecurity    Worry: Not on file    Inability: Not on file  . Transportation needs    Medical: Not on file    Non-medical: Not on file  Tobacco Use  . Smoking status: Former Smoker    Types: Cigarettes  . Smokeless tobacco: Never Used  Substance and Sexual Activity  . Alcohol use: No  . Drug use: No  . Sexual activity: Not on file  Lifestyle  . Physical activity    Days per week: Not on file    Minutes per session: Not on file  . Stress: Not on file  Relationships  . Social Herbalist on phone: Not on file    Gets together: Not on file    Attends religious service:  Not on file    Active member of club or organization: Not on file    Attends meetings of clubs or organizations: Not on file    Relationship status: Not on file  . Intimate partner violence    Fear of current or ex partner: Not on file    Emotionally abused: Not on file    Physically abused: Not on file    Forced sexual activity: Not on file  Other Topics Concern  . Not on file  Social History Narrative  . Not on file    Allergies  Allergen Reactions  . Clarithromycin Palpitations and Other (See Comments)  . Levofloxacin Palpitations and Other (See Comments)  . Moxifloxacin Palpitations and Other (See Comments)    Family History  Problem Relation Age of Onset  . Stroke Mother   . Heart disease Father     Prior to Admission medications   Medication Sig Start Date End Date Taking? Authorizing Provider  ADVAIR DISKUS 500-50 MCG/DOSE AEPB Inhale 1 puff into the lungs 2 (two) times daily. 11/08/18   [provider]  aspirin EC 81 MG tablet Take 81 mg by mouth daily.    [provider]  baclofen (LIORESAL) 20 MG tablet Take 20 mg by mouth 3 (three) times daily as needed for muscle spasms.    [provider]  colesevelam (WELCHOL) 625 MG tablet Take 1,875 mg by mouth 2 (two) times daily with a meal.    [provider]  Cyanocobalamin (B-12 COMPLIANCE INJECTION) 1000 MCG/ML KIT Inject 1 mL as directed every 30 (thirty) days.    [provider]  diclofenac sodium (VOLTAREN) 1 % GEL Apply 2 g topically 4 (four) times daily. Patient taking differently: Apply 2 g topically 3 (three) times daily as needed (pain).  02/27/17   Debbe Odea, MD  dicyclomine (BENTYL) 20 MG tablet Take 20 mg by mouth 3 (three) times daily before meals.    [provider]  fluticasone (FLONASE) 50 MCG/ACT nasal spray Place 2 sprays into both nostrils daily.    [provider]  FORTEO 600 MCG/2.4ML SOPN Inject 20 mcg into the muscle at bedtime.  02/20/19    [provider]  hydrALAZINE (APRESOLINE) 10 MG tablet Take 10 mg by mouth 2 (two) times daily. 04/03/19   [provider]  magnesium oxide (MAG-OX) 400 MG tablet Take 400 mg by mouth 2 (two) times daily.    [provider]  metFORMIN (GLUCOPHAGE-XR) 500 MG 24 hr tablet Take 500 mg by mouth daily as needed (High blood sugar).     [provider]  methadone (DOLOPHINE) 5  MG tablet Take 5 mg by mouth 5 (five) times daily.    [provider]  mirtazapine (REMERON) 30 MG tablet Take 30 mg by mouth at bedtime.    [provider]  Multiple Vitamins-Minerals (PRESERVISION AREDS 2+MULTI VIT PO) Take 1 capsule by mouth 2 (two) times daily.    [provider]  pramipexole (MIRAPEX) 0.125 MG tablet Take 0.125 mg by mouth 2 (two) times daily as needed. 03/24/19   [provider]  promethazine (PHENERGAN) 25 MG tablet Take 25 mg by mouth 3 (three) times daily as needed for nausea or vomiting.    [provider]  traMADol (ULTRAM) 50 MG tablet Take 1 tablet (50 mg total) by mouth every 6 (six) hours as needed for moderate pain. 05/12/19   Dagoberto Ligas, PA-C    Physical Exam: Vitals:   05/13/19 0730 05/13/19 0745 05/13/19 0800 05/13/19 0815  BP: (!) 111/46 (!) 154/66 136/70 (!) 145/65  Pulse: 73 98 88 89  Resp: _0 (!) 21  Temp:      TempSrc:      SpO2: 98% 93% 97% 95%  Weight:      Height:         . General:  Appears calm and comfortable and is NAD . Eyes:  PERRL, EOMI, normal lids, iris . ENT:  grossly normal hearing, lips & tongue, mmm; edentulous upper, mostly absent lower dentition . Neck:  Appropriately healing CEA incision with surrounding expected ecchymoses, no currently appreciated subcutaneous emphysema . Cardiovascular:  RRR, no m/r/g. No LE edema.  Marland Kitchen Respiratory:   CTA bilaterally with no wheezes/rales/rhonchi.  Normal respiratory effort.  She does have mild upper airway wheezing at this time. .  Abdomen:  soft, NT, ND, NABS; mild suprapubic fullness . Skin:  no rash or induration seen on limited exam . Musculoskeletal:  grossly normal tone BUE/BLE, good ROM, no bony abnormality . Psychiatric:  grossly normal mood and affect, speech fluent and appropriate, AOx2-3 . Neurologic:  CN 2-12 grossly intact, moves all extremities in coordinated fashion, sensation intact    Radiological Exams on Admission: Dg Chest 2 View  Result Date: 05/12/2019 CLINICAL DATA:  Shortness of breath EXAM: CHEST - 2 VIEW COMPARISON:  February 13, 2019 FINDINGS: There is atelectatic change in the left base with small left pleural effusion. The lungs elsewhere are clear. The heart is upper normal in size with pulmonary vascularity normal. There are multiple compression fractures throughout the thoracic and upper lumbar spine. Patient is undergone several midthoracic kyphoplasty procedures. There is increase in kyphosis with osteoporosis. Old healed rib fractures on the left noted. IMPRESSION: Atelectatic change in left base with small left pleural effusion. Lungs elsewhere clear. Heart upper normal in size. Bones osteoporotic with multiple thoracic compression fractures and evidence of kyphoplasty procedures at several levels. Old healed rib fractures on the left noted. Electronically Signed   By: Lowella Grip III M.D.   On: 05/12/2019 22:01   Ct Soft Tissue Neck Wo Contrast  Result Date: 05/13/2019 CLINICAL DATA:  Initial evaluation for acute shortness of breath, recent carotid endarterectomy. EXAM: CT NECK WITHOUT CONTRAST TECHNIQUE: Multidetector CT imaging of the neck was performed following the standard protocol without intravenous contrast. COMPARISON:  Prior study from 04/06/2019. FINDINGS: Pharynx and larynx: Oral cavity within normal limits without discrete mass or collection. No acute abnormality about the remaining dentition. Oropharynx and nasopharynx within normal limits. Parapharyngeal fat maintained.  Small layering retropharyngeal effusion, likely postoperative related to recent  endarterectomy. Epiglottis normal. Vallecula clear. Remainder of the hypopharynx and supraglottic larynx within normal limits. True cords symmetric and normal. Subglottic airway clear. Salivary glands: Salivary glands including the parotid and submandibular glands within normal limits. Thyroid: Subcentimeter hypodensity noted at the lower pole the left thyroid, of doubtful significance given size and patient age. Subcentimeter calcification noted within the lower pole the right thyroid. Thyroid otherwise unremarkable. Lymph nodes: No pathologically enlarged lymph nodes seen within the neck. Vascular: Moderate atherosclerotic change about the aortic arch. Postoperative changes from recent left carotid endarterectomy are seen, with multiple surgical clips about the left bifurcation. Adjacent soft tissue emphysema with stranding and swelling within the left neck, consistent with postoperative changes. No frank hematoma or other complication identified on this noncontrast examination. Bulky calcified plaque noted about the right bifurcation. Additional scattered atherosclerotic change noted within the carotid siphons. Limited intracranial: Unremarkable. Visualized orbits: Patient status post bilateral ocular lens replacement. Globes orbital soft tissues demonstrate no acute finding. Mastoids and visualized paranasal sinuses: Minimal mucosal thickening noted within the right maxillary sinus. Visualized paranasal sinuses are otherwise clear. Mastoid air cells and middle ear cavities are well pneumatized and free of fluid. Skeleton: No acute osseous abnormality. No discrete lytic or blastic osseous lesions. Multiple chronic compression deformities noted within the upper thoracic spine, several of which demonstrates sequelae of prior vertebral augmentation. Secondary exaggeration of the normal upper thoracic kyphosis. Moderate cervical  spondylosis noted at C5-6. Upper chest: Small layering bilateral pleural effusions partially visualized. Visualized lungs are otherwise grossly clear. Visualized upper chest demonstrates no other acute finding. Other: None. IMPRESSION: 1. Postoperative changes from recent left carotid endarterectomy with expected postoperative changes within the adjacent left neck. No discrete hematoma or other complication identified on this noncontrast examination. 2. Small layering bilateral pleural effusions. 3. Multiple old compression fractures within the visualized upper thoracic spine, several of which demonstrate changes of prior vertebral augmentation. Electronically Signed   By: Jeannine Boga M.D.   On: 05/13/2019 06:43    EKG: Independently reviewed.  NSR with rate 73; no evidence of acute ischemia   Labs on Admission: I have personally reviewed the available labs and imaging studies at the time of the admission.  Pertinent labs:   Glucose 150 BUN 29/Creatinine 1.76/GFR 28; 30/1.82/27 on 10/23 BNP 74.3 HS troponin 7, 9 WBC 7.7 Hgb 8.9 COVID pending, negative on 10/19   Assessment/Plan Principal Problem:   Postoperative urinary retention Active Problems:   RSD (reflex sympathetic dystrophy)   Type 2 diabetes mellitus without complication (HCC)   Diastolic dysfunction   Asthma   Carotid artery stenosis   Acute respiratory failure with hypoxia (HCC)   CKD (chronic kidney disease) stage 4, GFR 15-29 ml/min (HCC)   Postoperative urinary retention -Patient with CEA performed on 10/22, presenting with upper airway issues (see below) -Her primary complaint at the time of my evaluation was inability to void -She was placed on a PureWick but remained uncomfortably unable to void -Bladder scan performed and showed retention of >690 cc -Will perform I/O cath and start Flomax -If unable to void again, will place foley and patient will need outpatient urology f/u need week -Suspect that  this is bladder neck obstruction related to anesthesia and is likely to resolve over time  Acute respiratory failure with hypoxia -Patient's other apparent post-operative issue was SOB/wheezing/hypoxia -Her lungs are clear on exam but she does have upper airway noise -Her husband reported what sounds like subcutaneous emphysema - although this is improved -  The patient has underlying asthma/COPD for which she takes Advair -Will continue Advair (formulary substitution for [employer]) -Will also give Albuterol HFA -Continue home Flonase -Continue [place] O2 for now but likely can wean -Suspect that this was post-procedural bronchospasm -Based on current evaluation, I have low suspicion for PE - but VQ scan was previously ordered and tech called in to perform study now so will continue -Will observe overnight on tele with anticipated ongoing improvement and likely d/c to home tomorrow  Stage 4 CKD -Appears to be stable at this time, down from prior GFR 30-40 -Needs outpatient nephrology evaluation for possible fistula placement  RSD/RLS -Patient takes chronic Methadone for this -Rx was for 5 mg 5 times daily but she has recently been taking 2-3 times daily -Will order Methadone 5 mg TID for now -She is also prescribed Ultram, but this is not clearly indicated and so will hold -Baclofen is not recommended due to her CKD and so is held -Voltaren is not recommended due to her CKD and so is also held -Continue Remeron, Mirapex  HTN -Continue hydralazine  DM -A1c was 6.7 on 10/19 -hold Glucophage while inpatient but consider alternative medication given its nephrotoxigenicity -Cover with moderate-scale SSI  Chronic diastolic CHF -Appears to be compensated at this time -Normal Echo on 9/22 -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  DVT prophylaxis:  Lovenox  Code Status:  Full -  confirmed with patient/family Family Communication: Husband was present throughout evaluation  Disposition Plan:  Home once clinically improved Consults called: Vascular (notified of admission)  Admission status: It is my clinical opinion that referral for OBSERVATION is reasonable and necessary in this patient based on the above information provided. The aforementioned taken together are felt to place the patient at high risk for further clinical deterioration. However it is anticipated that the patient may be medically stable for discharge from the hospital within 24 to 48 hours.    Karmen Bongo MD [facility]   How to contact the [facility] Attending or Consulting provider [place] or covering provider during after hours [place], for this patient?  1. Check the care team in [facility] and look for a) attending/consulting [facility] provider listed and b) the [facility] team listed 2. Log into [email or web address] and use [facility]'s universal password to access. If you do not have the password, please contact the hospital operator. 3. Locate the [facility] provider you are looking for under [facility] and page to a number that you can be directly reached. 4. If you still have difficulty reaching the provider, please page the [facility] (Director on Call) for the Hospitalists listed on amion for assistance.   05/13/2019, 9:20 AM

## 2019-05-14 ENCOUNTER — Inpatient Hospital Stay (HOSPITAL_COMMUNITY): Payer: Medicare Other

## 2019-05-14 DIAGNOSIS — Z9841 Cataract extraction status, right eye: Secondary | ICD-10-CM | POA: Diagnosis not present

## 2019-05-14 DIAGNOSIS — I5032 Chronic diastolic (congestive) heart failure: Secondary | ICD-10-CM | POA: Diagnosis present

## 2019-05-14 DIAGNOSIS — I130 Hypertensive heart and chronic kidney disease with heart failure and stage 1 through stage 4 chronic kidney disease, or unspecified chronic kidney disease: Secondary | ICD-10-CM | POA: Diagnosis present

## 2019-05-14 DIAGNOSIS — K219 Gastro-esophageal reflux disease without esophagitis: Secondary | ICD-10-CM | POA: Diagnosis present

## 2019-05-14 DIAGNOSIS — J690 Pneumonitis due to inhalation of food and vomit: Secondary | ICD-10-CM | POA: Diagnosis present

## 2019-05-14 DIAGNOSIS — G4733 Obstructive sleep apnea (adult) (pediatric): Secondary | ICD-10-CM | POA: Diagnosis present

## 2019-05-14 DIAGNOSIS — Z8249 Family history of ischemic heart disease and other diseases of the circulatory system: Secondary | ICD-10-CM | POA: Diagnosis not present

## 2019-05-14 DIAGNOSIS — E782 Mixed hyperlipidemia: Secondary | ICD-10-CM | POA: Diagnosis present

## 2019-05-14 DIAGNOSIS — N184 Chronic kidney disease, stage 4 (severe): Secondary | ICD-10-CM | POA: Diagnosis present

## 2019-05-14 DIAGNOSIS — Z87891 Personal history of nicotine dependence: Secondary | ICD-10-CM | POA: Diagnosis not present

## 2019-05-14 DIAGNOSIS — Z90710 Acquired absence of both cervix and uterus: Secondary | ICD-10-CM | POA: Diagnosis not present

## 2019-05-14 DIAGNOSIS — E1122 Type 2 diabetes mellitus with diabetic chronic kidney disease: Secondary | ICD-10-CM | POA: Diagnosis present

## 2019-05-14 DIAGNOSIS — R338 Other retention of urine: Secondary | ICD-10-CM | POA: Diagnosis present

## 2019-05-14 DIAGNOSIS — Z20828 Contact with and (suspected) exposure to other viral communicable diseases: Secondary | ICD-10-CM | POA: Diagnosis present

## 2019-05-14 DIAGNOSIS — Z9889 Other specified postprocedural states: Secondary | ICD-10-CM | POA: Diagnosis not present

## 2019-05-14 DIAGNOSIS — J9601 Acute respiratory failure with hypoxia: Secondary | ICD-10-CM | POA: Diagnosis present

## 2019-05-14 DIAGNOSIS — J449 Chronic obstructive pulmonary disease, unspecified: Secondary | ICD-10-CM | POA: Diagnosis present

## 2019-05-14 DIAGNOSIS — G9050 Complex regional pain syndrome I, unspecified: Secondary | ICD-10-CM | POA: Diagnosis present

## 2019-05-14 DIAGNOSIS — M797 Fibromyalgia: Secondary | ICD-10-CM | POA: Diagnosis present

## 2019-05-14 DIAGNOSIS — G2581 Restless legs syndrome: Secondary | ICD-10-CM | POA: Diagnosis present

## 2019-05-14 DIAGNOSIS — N9989 Other postprocedural complications and disorders of genitourinary system: Secondary | ICD-10-CM | POA: Diagnosis not present

## 2019-05-14 DIAGNOSIS — Z9842 Cataract extraction status, left eye: Secondary | ICD-10-CM | POA: Diagnosis not present

## 2019-05-14 DIAGNOSIS — I2510 Atherosclerotic heart disease of native coronary artery without angina pectoris: Secondary | ICD-10-CM | POA: Diagnosis present

## 2019-05-14 DIAGNOSIS — E1140 Type 2 diabetes mellitus with diabetic neuropathy, unspecified: Secondary | ICD-10-CM | POA: Diagnosis present

## 2019-05-14 DIAGNOSIS — E119 Type 2 diabetes mellitus without complications: Secondary | ICD-10-CM | POA: Diagnosis not present

## 2019-05-14 DIAGNOSIS — R0902 Hypoxemia: Secondary | ICD-10-CM | POA: Diagnosis present

## 2019-05-14 DIAGNOSIS — Z9981 Dependence on supplemental oxygen: Secondary | ICD-10-CM | POA: Diagnosis not present

## 2019-05-14 DIAGNOSIS — I6522 Occlusion and stenosis of left carotid artery: Secondary | ICD-10-CM | POA: Diagnosis not present

## 2019-05-14 DIAGNOSIS — R0602 Shortness of breath: Secondary | ICD-10-CM | POA: Diagnosis present

## 2019-05-14 LAB — BASIC METABOLIC PANEL
Anion gap: 7 (ref 5–15)
BUN: 26 mg/dL — ABNORMAL HIGH (ref 8–23)
CO2: 29 mmol/L (ref 22–32)
Calcium: 8.4 mg/dL — ABNORMAL LOW (ref 8.9–10.3)
Chloride: 104 mmol/L (ref 98–111)
Creatinine, Ser: 1.51 mg/dL — ABNORMAL HIGH (ref 0.44–1.00)
GFR calc Af Amer: 39 mL/min — ABNORMAL LOW (ref >60)
GFR calc non Af Amer: 34 mL/min — ABNORMAL LOW (ref >60)
Glucose, Bld: 110 mg/dL — ABNORMAL HIGH (ref 70–99)
Potassium: 4.4 mmol/L (ref 3.5–5.1)
Sodium: 140 mmol/L (ref 135–145)

## 2019-05-14 LAB — CBC
HCT: 25.5 % — ABNORMAL LOW (ref 36.0–46.0)
Hemoglobin: 8.1 g/dL — ABNORMAL LOW (ref 12.0–15.0)
MCH: 31.2 pg (ref 26.0–34.0)
MCHC: 31.8 g/dL (ref 30.0–36.0)
MCV: 98.1 fL (ref 80.0–100.0)
Platelets: 128 10*3/uL — ABNORMAL LOW (ref 150–400)
RBC: 2.6 MIL/uL — ABNORMAL LOW (ref 3.87–5.11)
RDW: 13.2 % (ref 11.5–15.5)
WBC: 4.6 10*3/uL (ref 4.0–10.5)
nRBC: 0 % (ref 0.0–0.2)

## 2019-05-14 LAB — GLUCOSE, CAPILLARY
Glucose-Capillary: 165 mg/dL — ABNORMAL HIGH (ref 70–99)
Glucose-Capillary: 111 mg/dL — ABNORMAL HIGH (ref 70–99)
Glucose-Capillary: 158 mg/dL — ABNORMAL HIGH (ref 70–99)
Glucose-Capillary: 93 mg/dL (ref 70–99)

## 2019-05-14 MED ORDER — FUROSEMIDE 10 MG/ML IJ SOLN
20.0000 mg | Freq: Once | INTRAMUSCULAR | Status: AC
  Administered 2019-05-14: 20 mg via INTRAVENOUS
  Filled 2019-05-14: qty 2

## 2019-05-14 NOTE — Plan of Care (Signed)
Problem: Education: Goal: Knowledge of General Education information will improve Description Including pain rating scale, medication(s)/side effects and non-pharmacologic comfort measures Outcome: Progressing   Problem: Health Behavior/Discharge Planning: Goal: Ability to manage health-related needs will improve Outcome: Progressing   Problem: Clinical Measurements: Goal: Respiratory complications will improve Outcome: Progressing Goal: Cardiovascular complication will be avoided Outcome: Progressing   Problem: Activity: Goal: Risk for activity intolerance will decrease Outcome: Progressing   Problem: Nutrition: Goal: Adequate nutrition will be maintained Outcome: Progressing   Problem: Coping: Goal: Level of anxiety will decrease Outcome: Progressing   Problem: Pain Managment: Goal: General experience of comfort will improve Outcome: Progressing   Problem: Safety: Goal: Ability to remain free from injury will improve Outcome: Progressing   Problem: Skin Integrity: Goal: Risk for impaired skin integrity will decrease Outcome: Progressing   

## 2019-05-14 NOTE — Progress Notes (Signed)
Progress Note    Danielle Fuller  [id number] DOB: 02-Jun-1946  DOA: 05/12/2019 PCP: Ernestene Kiel, MD    Brief Narrative:     Medical records reviewed and are as summarized below:  Danielle Fuller is an 73 y.o. 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  She had L CEA on 10/22.  She stayed overnight and her husband took her home yesterday.  She ate and got sick and felt unable to walk.  Her chest was red and swollen.  It looked like her chest "had air in it or something,"  She was feeling SOB with wheezing and this is a little better now.  She just "got so sick", she couldn't breathe  Assessment/Plan:   Principal Problem:   Postoperative urinary retention Active Problems:   RSD (reflex sympathetic dystrophy)   Type 2 diabetes mellitus without complication (HCC)   Diastolic dysfunction   Asthma   Carotid artery stenosis   Acute respiratory failure with hypoxia (HCC)   CKD (chronic kidney disease) stage 4, GFR 15-29 ml/min (HCC)   Hypoxia     Acute respiratory failure with hypoxia -Her husband reported what sounds like subcutaneous emphysema - although this is improved -The patient has underlying asthma/COPD for which she takes Advair -Will continue Advair (formulary substitution for [facility]) -Will also give Albuterol HFA -Continue home Flonase -Continue [place] O2 -- have been attempting to wean -v/q scan low risk for PE -will get CT scan to evaluate lung fields -patient does not recall having PFTs- will need outpatient -continue agressive pulmonary toilet as well  Stage 4 CKD -Appears to be stable at this time, down from prior GFR 30-40 -Needs outpatient nephrology evaluation  RSD/RLS -Patient takes chronic Methadone for this -Rx was for 5 mg 5 times daily but she has recently been taking 2-3 times daily -Will order Methadone 5 mg TID for  now -She is also prescribed Ultram, but this is not clearly indicated and so will hold -Baclofen is not recommended due to her CKD and so is held -Voltaren is not recommended due to her CKD and so is also held -Continue Remeron, Mirapex  HTN -Continue hydralazine  DM -A1c was 6.7 on 10/19 -hold Glucophage while inpatient but consider alternative medication given its nephrotoxigenicity -Cover with moderate-scale SSI  Chronic diastolic CHF -Normal Echo on 9/22 -Cath performed on 10/8 with nonobstructive CAD -Continue ASA -do 1 dose of IV lasix for x ray that shows: small left pleural effusion  Postoperative urinary retention -appears resolved as patient is voiding on own fine  Family Communication/Anticipated D/C date and plan/Code Status   DVT prophylaxis: Lovenox ordered. Code Status: Full Code.  Family Communication:  Disposition Plan: pending resolution of hypoxia   Medical Consultants:    None.    Subjective:   Says she has been getting progressively SOB over last 1 year-- former smoker  Objective:    Vitals:   05/14/19 0540 05/14/19 0931 05/14/19 1101 05/14/19 1328  BP: (!) 154/63   (!) 133/52  Pulse: 70   78  Resp:    17  Temp: 98.4 F (36.9 C)   99.4 F (37.4 C)  TempSrc: Oral   Oral  SpO2: 99% 95% (!) 87% 90%  Weight:      Height:        Intake/Output Summary (Last 24 hours) at 05/14/2019 1359 Last data filed at  05/14/2019 0745 Gross per 24 hour  Intake 360 ml  Output -  Net 360 ml   Filed Weights   05/12/19 2114 05/13/19 1622  Weight: 47.2 kg 52.5 kg    Exam: In bed, pleasant and cooperative Wound on left neck healing well No chest wall crepitus rrr Coarse almost "velcro-like" breath sounds  Data Reviewed:   I have personally reviewed following labs and imaging studies:  Labs: Labs show the following:   Basic Metabolic Panel: Recent Labs  Lab 05/08/19 1540 05/12/19 0500 05/12/19 1448 05/12/19 2203 05/14/19 0405   NA 137 139 145 139 140  K 4.9 6.2* 4.2 4.3 4.4  CL 104 104 110 106 104  CO2 24 25 26 23 29   GLUCOSE 121* 132* 81 150* 110*  BUN 24* 30* 30* 29* 26*  CREATININE 1.50* 1.72* 1.82* 1.76* 1.51*  CALCIUM 9.1 6.8* 8.6* 8.5* 8.4*   GFR Estimated Creatinine Clearance: 22.8 mL/min (A) (by C-G formula based on SCr of 1.51 mg/dL (H)). Liver Function Tests: Recent Labs  Lab 05/08/19 1540  AST 18  ALT 11  ALKPHOS 68  BILITOT 0.4  PROT 6.9  ALBUMIN 3.9   No results for input(s): LIPASE, AMYLASE in the last 168 hours. No results for input(s): AMMONIA in the last 168 hours. Coagulation profile Recent Labs  Lab 05/08/19 1540  INR 1.0    CBC: Recent Labs  Lab 05/08/19 1540 05/12/19 0500 05/12/19 2203 05/14/19 0405  WBC 6.8 7.0 7.7 4.6  HGB 11.1* 8.0* 8.9* 8.1*  HCT 34.1* 25.9* 28.2* 25.5*  MCV 97.4 98.5 99.3 98.1  PLT 187 147* 167 128*   Cardiac Enzymes: No results for input(s): CKTOTAL, CKMB, CKMBINDEX, TROPONINI in the last 168 hours. BNP (last 3 results) No results for input(s): PROBNP in the last 8760 hours. CBG: Recent Labs  Lab 05/12/19 1200 05/13/19 1706 05/13/19 2154 05/14/19 0736 05/14/19 1150  GLUCAP 165* 193* 113* 165* 111*   D-Dimer: No results for input(s): DDIMER in the last 72 hours. Hgb A1c: No results for input(s): HGBA1C in the last 72 hours. Lipid Profile: No results for input(s): CHOL, HDL, LDLCALC, TRIG, CHOLHDL, LDLDIRECT in the last 72 hours. Thyroid function studies: No results for input(s): TSH, T4TOTAL, T3FREE, THYROIDAB in the last 72 hours.  Invalid input(s): FREET3 Anemia work up: No results for input(s): VITAMINB12, FOLATE, FERRITIN, TIBC, IRON, RETICCTPCT in the last 72 hours. Sepsis Labs: Recent Labs  Lab 05/08/19 1540 05/12/19 0500 05/12/19 2203 05/14/19 0405  WBC 6.8 7.0 7.7 4.6    Microbiology Recent Results (from the past 240 hour(s))  Novel Coronavirus, NAA (Hosp order, Send-out to Ref Lab; TAT 18-24 hrs     Status:  None   Collection Time: 05/08/19  2:11 PM   Specimen: Nasopharyngeal Swab; Respiratory  Result Value Ref Range Status   SARS-CoV-2, NAA NOT DETECTED NOT DETECTED Final    Comment: (NOTE) This nucleic acid amplification test was developed and its performance characteristics determined by [employer]. Nucleic acid amplification tests include PCR and TMA. This test has not been FDA cleared or approved. This test has been authorized by FDA under an Emergency Use Authorization (EUA). This test is only authorized for the duration of time the declaration that circumstances exist justifying the authorization of the emergency use of in vitro diagnostic tests for detection of SARS-CoV-2 virus and/or diagnosis of COVID-19 infection under section 564(b)(1) of the Act, 21 U.S.C. [id number]) (1), unless the authorization is terminated or revoked sooner. When diagnostic  testing is negative, the possibility of a false negative result should be considered in the context of a patient's recent exposures and the presence of clinical signs and symptoms consistent with COVID-19. An individual without symptoms of COVID- 19 and who is not shedding SARS-CoV-2 vi rus would expect to have a negative (not detected) result in this assay. Performed At: [facility] [address] [place], [place] [id number] Rush Farmer MD [id number]    Liberal  Final    Comment: Performed at [facility], [place] [address] [place], [place] [address]  Surgical pcr screen     Status: Abnormal   Collection Time: 05/08/19  3:41 PM   Specimen: Nasal Mucosa; Nasal Swab  Result Value Ref Range Status   MRSA, PCR POSITIVE (A) NEGATIVE Final    Comment: RESULT CALLED TO, READ BACK BY AND VERIFIED WITH: K MURRAY RN 05/08/19 1807 JDW    Staphylococcus aureus POSITIVE (A) NEGATIVE Final    Comment: (NOTE) The Xpert SA Assay (FDA approved for NASAL specimens in patients 53 years of  age and older), is one component of a comprehensive surveillance program. It is not intended to diagnose infection nor to guide or monitor treatment. Performed at [facility], [place] [address], [place], [place] [address]   SARS CORONAVIRUS 2 (TAT 6-24 HRS) Nasopharyngeal Nasopharyngeal Swab     Status: None   Collection Time: 05/13/19  6:23 AM   Specimen: Nasopharyngeal Swab  Result Value Ref Range Status   SARS Coronavirus 2 NEGATIVE NEGATIVE Final    Comment: (NOTE) SARS-CoV-2 target nucleic acids are NOT DETECTED. The SARS-CoV-2 RNA is generally detectable in upper and lower respiratory specimens during the acute phase of infection. Negative results do not preclude SARS-CoV-2 infection, do not rule out co-infections with other pathogens, and should not be used as the sole basis for treatment or other patient management decisions. Negative results must be combined with clinical observations, patient history, and epidemiological information. The expected result is Negative. Fact Sheet for Patients: SugarRoll.be Fact Sheet for Healthcare Providers: https://www.woods-mathews.com/ This test is not yet approved or cleared by the [place] FDA and  has been authorized for detection and/or diagnosis of SARS-CoV-2 by FDA under an Emergency Use Authorization (EUA). This EUA will remain  in effect (meaning this test can be used) for the duration of the COVID-19 declaration under Section 56 4(b)(1) of the Act, 21 U.S.C. section 360bbb-3(b)(1), unless the authorization is terminated or revoked sooner. Performed at [facility], [place] [address] [place], [place] [address]     Procedures and diagnostic studies:  Dg Chest 2 View  Result Date: 05/12/2019 CLINICAL DATA:  Shortness of breath EXAM: CHEST - 2 VIEW COMPARISON:  February 13, 2019 FINDINGS: There is atelectatic change in the left base with small left pleural effusion. The lungs  elsewhere are clear. The heart is upper normal in size with pulmonary vascularity normal. There are multiple compression fractures throughout the thoracic and upper lumbar spine. Patient is undergone several midthoracic kyphoplasty procedures. There is increase in kyphosis with osteoporosis. Old healed rib fractures on the left noted. IMPRESSION: Atelectatic change in left base with small left pleural effusion. Lungs elsewhere clear. Heart upper normal in size. Bones osteoporotic with multiple thoracic compression fractures and evidence of kyphoplasty procedures at several levels. Old healed rib fractures on the left noted. Electronically Signed   By: Lowella Grip III M.D.   On: 05/12/2019 22:01   Ct Soft  Tissue Neck Wo Contrast  Result Date: 05/13/2019 CLINICAL DATA:  Initial evaluation for acute shortness of breath, recent carotid endarterectomy. EXAM: CT NECK WITHOUT CONTRAST TECHNIQUE: Multidetector CT imaging of the neck was performed following the standard protocol without intravenous contrast. COMPARISON:  Prior study from 04/06/2019. FINDINGS: Pharynx and larynx: Oral cavity within normal limits without discrete mass or collection. No acute abnormality about the remaining dentition. Oropharynx and nasopharynx within normal limits. Parapharyngeal fat maintained. Small layering retropharyngeal effusion, likely postoperative related to recent endarterectomy. Epiglottis normal. Vallecula clear. Remainder of the hypopharynx and supraglottic larynx within normal limits. True cords symmetric and normal. Subglottic airway clear. Salivary glands: Salivary glands including the parotid and submandibular glands within normal limits. Thyroid: Subcentimeter hypodensity noted at the lower pole the left thyroid, of doubtful significance given size and patient age. Subcentimeter calcification noted within the lower pole the right thyroid. Thyroid otherwise unremarkable. Lymph nodes: No pathologically enlarged lymph  nodes seen within the neck. Vascular: Moderate atherosclerotic change about the aortic arch. Postoperative changes from recent left carotid endarterectomy are seen, with multiple surgical clips about the left bifurcation. Adjacent soft tissue emphysema with stranding and swelling within the left neck, consistent with postoperative changes. No frank hematoma or other complication identified on this noncontrast examination. Bulky calcified plaque noted about the right bifurcation. Additional scattered atherosclerotic change noted within the carotid siphons. Limited intracranial: Unremarkable. Visualized orbits: Patient status post bilateral ocular lens replacement. Globes orbital soft tissues demonstrate no acute finding. Mastoids and visualized paranasal sinuses: Minimal mucosal thickening noted within the right maxillary sinus. Visualized paranasal sinuses are otherwise clear. Mastoid air cells and middle ear cavities are well pneumatized and free of fluid. Skeleton: No acute osseous abnormality. No discrete lytic or blastic osseous lesions. Multiple chronic compression deformities noted within the upper thoracic spine, several of which demonstrates sequelae of prior vertebral augmentation. Secondary exaggeration of the normal upper thoracic kyphosis. Moderate cervical spondylosis noted at C5-6. Upper chest: Small layering bilateral pleural effusions partially visualized. Visualized lungs are otherwise grossly clear. Visualized upper chest demonstrates no other acute finding. Other: None. IMPRESSION: 1. Postoperative changes from recent left carotid endarterectomy with expected postoperative changes within the adjacent left neck. No discrete hematoma or other complication identified on this noncontrast examination. 2. Small layering bilateral pleural effusions. 3. Multiple old compression fractures within the visualized upper thoracic spine, several of which demonstrate changes of prior vertebral augmentation.  Electronically Signed   By: Jeannine Boga M.D.   On: 05/13/2019 06:43   Nm Pulmonary Perfusion  Result Date: 05/13/2019 CLINICAL DATA:  Short of breath. EXAM: NUCLEAR MEDICINE PERFUSION LUNG SCAN TECHNIQUE: Perfusion images were obtained in multiple projections after intravenous injection of radiopharmaceutical. Ventilation scans intentionally deferred if perfusion scan and chest x-ray adequate for interpretation during COVID 19 epidemic. RADIOPHARMACEUTICALS:  1.56 mCi Tc-56m MAA IV COMPARISON:  Chest radiograph 05/12/2019 FINDINGS: No segmental perfusion defects identified suspicious for acute pulmonary embolus. IMPRESSION: Negative for acute pulmonary embolus. Electronically Signed   By: Kerby Moors M.D.   On: 05/13/2019 10:12    Medications:   . aspirin EC  81 mg Oral Daily  . colesevelam  1,875 mg Oral BID WC  . docusate sodium  100 mg Oral BID  . enoxaparin (LOVENOX) injection  30 mg Subcutaneous Q24H  . fluticasone  2 spray Each Nare Daily  . furosemide  20 mg Intravenous Once  . hydrALAZINE  10 mg Oral BID  . insulin aspart  0-5 Units Subcutaneous QHS  .  insulin aspart  0-9 Units Subcutaneous TID WC  . magnesium oxide  400 mg Oral BID  . methadone  5 mg Oral Q8H  . mirtazapine  30 mg Oral QHS  . mometasone-formoterol  2 puff Inhalation BID  . tamsulosin  0.4 mg Oral Daily   Continuous Infusions:   LOS: 0 days   Geradine Girt  [facility]   How to contact the [facility] Attending or Consulting provider [place] or covering provider during after hours [place], for this patient?  1. Check the care team in [facility] and look for a) attending/consulting [facility] provider listed and b) the [facility] team listed 2. Log into [email or web address] and use [place]'s universal password to access. If you do not have the password, please contact the hospital operator. 3. Locate the [facility] provider you are looking for under [facility] and page to a number that you can be directly reached. 4.  If you still have difficulty reaching the provider, please page the [facility] (Director on Call) for the Hospitalists listed on amion for assistance.  05/14/2019, 1:59 PM

## 2019-05-14 NOTE — Care Management Obs Status (Signed)
[place] NOTIFICATION   Patient Details  Name: Danielle Fuller MRN: [id number] Date of Birth: 07/07/1946   Medicare Observation Status Notification Given:  Yes    Claudie Leach, RN 05/14/2019, 10:59 AM

## 2019-05-15 DIAGNOSIS — E119 Type 2 diabetes mellitus without complications: Secondary | ICD-10-CM

## 2019-05-15 DIAGNOSIS — J9601 Acute respiratory failure with hypoxia: Secondary | ICD-10-CM

## 2019-05-15 DIAGNOSIS — I6522 Occlusion and stenosis of left carotid artery: Secondary | ICD-10-CM

## 2019-05-15 LAB — CBC
HCT: 26.6 % — ABNORMAL LOW (ref 36.0–46.0)
Hemoglobin: 8.6 g/dL — ABNORMAL LOW (ref 12.0–15.0)
MCH: 31 pg (ref 26.0–34.0)
MCHC: 32.3 g/dL (ref 30.0–36.0)
MCV: 96 fL (ref 80.0–100.0)
Platelets: 144 10*3/uL — ABNORMAL LOW (ref 150–400)
RBC: 2.77 MIL/uL — ABNORMAL LOW (ref 3.87–5.11)
RDW: 12.8 % (ref 11.5–15.5)
WBC: 5 10*3/uL (ref 4.0–10.5)
nRBC: 0 % (ref 0.0–0.2)

## 2019-05-15 LAB — GLUCOSE, CAPILLARY
Glucose-Capillary: 114 mg/dL — ABNORMAL HIGH (ref 70–99)
Glucose-Capillary: 214 mg/dL — ABNORMAL HIGH (ref 70–99)
Glucose-Capillary: 122 mg/dL — ABNORMAL HIGH (ref 70–99)
Glucose-Capillary: 244 mg/dL — ABNORMAL HIGH (ref 70–99)
Glucose-Capillary: 121 mg/dL — ABNORMAL HIGH (ref 70–99)

## 2019-05-15 LAB — BASIC METABOLIC PANEL
Anion gap: 9 (ref 5–15)
BUN: 20 mg/dL (ref 8–23)
CO2: 29 mmol/L (ref 22–32)
Calcium: 8.6 mg/dL — ABNORMAL LOW (ref 8.9–10.3)
Chloride: 101 mmol/L (ref 98–111)
Creatinine, Ser: 1.48 mg/dL — ABNORMAL HIGH (ref 0.44–1.00)
GFR calc Af Amer: 40 mL/min — ABNORMAL LOW (ref >60)
GFR calc non Af Amer: 35 mL/min — ABNORMAL LOW (ref >60)
Glucose, Bld: 96 mg/dL (ref 70–99)
Potassium: 4.2 mmol/L (ref 3.5–5.1)
Sodium: 139 mmol/L (ref 135–145)

## 2019-05-15 MED ORDER — SODIUM CHLORIDE 0.9 % IV SOLN
3.0000 g | Freq: Two times a day (BID) | INTRAVENOUS | Status: DC
  Administered 2019-05-15 – 2019-05-16 (×3): 3 g via INTRAVENOUS
  Filled 2019-05-15 (×2): qty 8
  Filled 2019-05-15: qty 3
  Filled 2019-05-15: qty 8

## 2019-05-15 MED ORDER — BACLOFEN 10 MG PO TABS
10.0000 mg | ORAL_TABLET | Freq: Every day | ORAL | Status: DC | PRN
  Administered 2019-05-15: 10 mg via ORAL
  Filled 2019-05-15: qty 1

## 2019-05-15 NOTE — Plan of Care (Signed)
  Problem: Education: Goal: Knowledge of General Education information will improve Description: Including pain rating scale, medication(s)/side effects and non-pharmacologic comfort measures Outcome: Progressing   Problem: Health Behavior/Discharge Planning: Goal: Ability to manage health-related needs will improve Outcome: Progressing   Problem: Clinical Measurements: Goal: Respiratory complications will improve Outcome: Progressing   Problem: Activity: Goal: Risk for activity intolerance will decrease Outcome: Progressing   Problem: Nutrition: Goal: Adequate nutrition will be maintained Outcome: Progressing   Problem: Pain Managment: Goal: General experience of comfort will improve Outcome: Progressing   Problem: Safety: Goal: Ability to remain free from injury will improve Outcome: Progressing   Problem: Skin Integrity: Goal: Risk for impaired skin integrity will decrease Outcome: Progressing

## 2019-05-15 NOTE — Progress Notes (Signed)
   Neuro in tact following left cea. Appreciate medical team assistance with copd and urinary retention. Will follow peripherally while admitted.   Eulanda Dorion C. Donzetta Matters, MD [facility] Office: [phone] Pager: [phone]

## 2019-05-15 NOTE — Progress Notes (Signed)
PROGRESS NOTE    Danielle Fuller  [id number] DOB: May 06, 1946 DOA: 05/12/2019  PCP: Ernestene Kiel, MD    LOS - 1   Brief Narrative:  Danielle Fuller is an 73 y.o. 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 She stayed overnight and her husband took her home yesterday. She ate and got sick and felt unable to walk. Her chest was red and swollen. It looked like her chest "had air in it or something," She was feeling SOB with wheezing and this is a little better now. She just "got so sick", she couldn't breathe.   Patient does not require supplemental oxygen at baseline, but does have a history of COPD.  CT soft tissue of the neck obtained given history exam and consistent with subcutaneous emphysema, he was negative for that or any complications related to her carotid endarterectomy.  VQ scan obtained and negative for PE.  Subsequently CT chest without contrast obtained and showed bibasilar atelectasis and tree-in-bud appearance in the right lower lobe concerning for infection.  Patient started on Unasyn for presumed aspiration pneumonia speech therapy consulted for swallow evaluation.  Subjective 10/26: Patient seen and examined awake sitting up in bed.  She does report occasionally choking with swallowing.  Reports a bout of pneumonia previously that was treated outpatient but never seemed to resolve despite antibiotics (she mentions doxycycline and amoxicillin).  Patient denies fevers or chills, reports intermittent cough.  She denies chest pain or shortness of breath at this time.  Remains on 3 L/min supplemental oxygen.  Assessment & Plan:   Principal Problem:   Postoperative urinary retention Active Problems:   RSD (reflex sympathetic dystrophy)   Type 2 diabetes mellitus without complication (HCC)   Diastolic dysfunction   Asthma  Carotid artery stenosis   Acute respiratory failure with hypoxia (HCC)   CKD (chronic kidney disease) stage 4, GFR 15-29 ml/min (HCC)   Hypoxia   Acute respiratory failure with hypoxia Suspected Aspiration Pneumonia -Her husband reported what sounds like subcutaneous emphysema - although this is improved -Hx asthma/COPD on Advair -continue Advair (formulary substitution for Dulera) -Albuterol HFA -Continue home Flonase -Continue [place] O2 -- have been attempting to wean -v/q scan low risk for PE -will get CT scan to evaluate lung fields -patient does not recall having PFTs- will need outpatient -continue agressive pulmonary toilet as well -Unasyn started 10/26 based on results of CT chest and patient report of apparent aspiration - speech therapy consulted  Chronic kidney disease, stage IV -  -Appears to be stable at this time, down from prior GFR 30-40 -Needs outpatient nephrology evaluation  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 - 5 mg 5 times daily but she has recently been taking 2-3 times daily -Methadone 5 mg TID ordered for now -She is also prescribed Ultram, but this is not clearly indicated and so will hold -Baclofen initially held due to CKD, patient requested so resumed at half dose -Voltaren is not recommended due to her CKD and so is also held -Continue Remeron, Mirapex  Hypertension -Continue hydralazine  Type 2 diabetes -A1c was 6.7 on 10/19 -hold Glucophagewhile inpatient but consider alternative medication given its nephrotoxigenicity -Cover with moderate-scale SSI  Chronic diastolic CHF -Normal Echo on 9/22 -Cath performed on 10/8 with nonobstructive CAD -Continue ASA -do 1 dose of IV lasix for x ray that shows: small left pleural effusion  Postoperative  urinary retention -appears resolved as patient is voiding on own fine   DVT prophylaxis: Lovenox ordered. Code Status: Full Code.  Family Communication:  Disposition Plan: pending resolution of  hypoxia   Antimicrobials:   Unasyn, start 10/26   Objective: Vitals:   05/14/19 2003 05/14/19 2028 05/15/19 0453 05/15/19 0500  BP: 133/62  134/79   Pulse: 70  68   Resp: 18  17   Temp: 99.4 F (37.4 C)  98.1 F (36.7 C)   TempSrc: Oral  Oral   SpO2: 96% 92% 97%   Weight:    55.4 kg  Height:        Intake/Output Summary (Last 24 hours) at 05/15/2019 [id number] Last data filed at 05/14/2019 2150 Gross per 24 hour  Intake -  Output 700 ml  Net -700 ml   Filed Weights   05/13/19 1622 05/14/19 1456 05/15/19 0500  Weight: 52.5 kg 55.4 kg 55.4 kg    Examination:  General exam: awake, alert, no acute distress, frail HEENT: Left CEA scar present without surrounding erythema or other signs of infection, moist mucus membranes, hearing grossly normal  Respiratory system: Decreased breath sounds at bases bilaterally, no wheezes, rales or rhonchi, normal respiratory effort.  On 3 L/min oxygen Cardiovascular system: normal S1/S2, RRR, no pedal edema.   Gastrointestinal system: soft, non-tender, non-distended abdomen, normal bowel sounds. Central nervous system: alert and oriented x4. no gross focal neurologic deficits, normal speech Extremities: moves all, normal tone Skin: dry, intact, normal temperature Psychiatry: normal mood, congruent affect, judgement and insight appear normal    Data Reviewed: I have personally reviewed following labs and imaging studies  CBC: Recent Labs  Lab 05/08/19 1540 05/12/19 0500 05/12/19 2203 05/14/19 0405 05/15/19 0330  WBC 6.8 7.0 7.7 4.6 5.0  HGB 11.1* 8.0* 8.9* 8.1* 8.6*  HCT 34.1* 25.9* 28.2* 25.5* 26.6*  MCV 97.4 98.5 99.3 98.1 96.0  PLT 187 147* 167 128* [id number]*   Basic Metabolic Panel: Recent Labs  Lab 05/12/19 0500 05/12/19 1448 05/12/19 2203 05/14/19 0405 05/15/19 0330  NA 139 145 139 140 139  K 6.2* 4.2 4.3 4.4 4.2  CL 104 110 106 104 101  CO2 25 26 23 29 29   GLUCOSE 132* 81 150* 110* 96  BUN 30* 30* 29* 26* 20   CREATININE 1.72* 1.82* 1.76* 1.51* 1.48*  CALCIUM 6.8* 8.6* 8.5* 8.4* 8.6*   GFR: Estimated Creatinine Clearance: 23.8 mL/min (A) (by C-G formula based on SCr of 1.48 mg/dL (H)). Liver Function Tests: Recent Labs  Lab 05/08/19 1540  AST 18  ALT 11  ALKPHOS 68  BILITOT 0.4  PROT 6.9  ALBUMIN 3.9   No results for input(s): LIPASE, AMYLASE in the last 168 hours. No results for input(s): AMMONIA in the last 168 hours. Coagulation Profile: Recent Labs  Lab 05/08/19 1540  INR 1.0   Cardiac Enzymes: No results for input(s): CKTOTAL, CKMB, CKMBINDEX, TROPONINI in the last 168 hours. BNP (last 3 results) No results for input(s): PROBNP in the last 8760 hours. HbA1C: No results for input(s): HGBA1C in the last 72 hours. CBG: Recent Labs  Lab 05/14/19 0736 05/14/19 1150 05/14/19 1715 05/14/19 2129 05/15/19 0809  GLUCAP 165* 111* 158* 93 214*   Lipid Profile: No results for input(s): CHOL, HDL, LDLCALC, TRIG, CHOLHDL, LDLDIRECT in the last 72 hours. Thyroid Function Tests: No results for input(s): TSH, T4TOTAL, FREET4, T3FREE, THYROIDAB in the last 72 hours. Anemia Panel: No results for input(s): VITAMINB12, FOLATE, FERRITIN, TIBC, IRON,  RETICCTPCT in the last 72 hours. Sepsis Labs: No results for input(s): PROCALCITON, LATICACIDVEN in the last 168 hours.  Recent Results (from the past 240 hour(s))  Novel Coronavirus, NAA (Hosp order, Send-out to Ref Lab; TAT 18-24 hrs     Status: None   Collection Time: 05/08/19  2:11 PM   Specimen: Nasopharyngeal Swab; Respiratory  Result Value Ref Range Status   SARS-CoV-2, NAA NOT DETECTED NOT DETECTED Final    Comment: (NOTE) This nucleic acid amplification test was developed and its performance characteristics determined by [employer]. Nucleic acid amplification tests include PCR and TMA. This test has not been FDA cleared or approved. This test has been authorized by FDA under an Emergency Use Authorization (EUA).  This test is only authorized for the duration of time the declaration that circumstances exist justifying the authorization of the emergency use of in vitro diagnostic tests for detection of SARS-CoV-2 virus and/or diagnosis of COVID-19 infection under section 564(b)(1) of the Act, 21 U.S.C. [id number]) (1), unless the authorization is terminated or revoked sooner. When diagnostic testing is negative, the possibility of a false negative result should be considered in the context of a patient's recent exposures and the presence of clinical signs and symptoms consistent with COVID-19. An individual without symptoms of COVID- 19 and who is not shedding SARS-CoV-2 vi rus would expect to have a negative (not detected) result in this assay. Performed At: [facility] [address] [place], [place] [id number] Rush Farmer MD [id number]    [place]  Final    Comment: Performed at [facility], [place] [address] [place], [place] [address]  Surgical pcr screen     Status: Abnormal   Collection Time: 05/08/19  3:41 PM   Specimen: Nasal Mucosa; Nasal Swab  Result Value Ref Range Status   MRSA, PCR POSITIVE (A) NEGATIVE Final    Comment: RESULT CALLED TO, READ BACK BY AND VERIFIED WITH: K MURRAY RN 05/08/19 1807 JDW    Staphylococcus aureus POSITIVE (A) NEGATIVE Final    Comment: (NOTE) The Xpert SA Assay (FDA approved for NASAL specimens in patients 61 years of age and older), is one component of a comprehensive surveillance program. It is not intended to diagnose infection nor to guide or monitor treatment. Performed at [facility], [place] [address] [place], [place] [address]   SARS CORONAVIRUS 2 (TAT 6-24 HRS) Nasopharyngeal Nasopharyngeal Swab     Status: None   Collection Time: 05/13/19  6:23 AM   Specimen: Nasopharyngeal Swab  Result Value Ref Range Status   SARS Coronavirus 2 NEGATIVE NEGATIVE Final    Comment: (NOTE) SARS-CoV-2  target nucleic acids are NOT DETECTED. The SARS-CoV-2 RNA is generally detectable in upper and lower respiratory specimens during the acute phase of infection. Negative results do not preclude SARS-CoV-2 infection, do not rule out co-infections with other pathogens, and should not be used as the sole basis for treatment or other patient management decisions. Negative results must be combined with clinical observations, patient history, and epidemiological information. The expected result is Negative. Fact Sheet for Patients: SugarRoll.be Fact Sheet for Healthcare Providers: https://www.woods-mathews.com/ This test is not yet approved or cleared by the [place] FDA and  has been authorized for detection and/or diagnosis of SARS-CoV-2 by FDA under an Emergency Use Authorization (EUA). This EUA will remain  in effect (meaning this test can be used) for the duration of the COVID-19 declaration under Section 56 4(b)(1) of the Act, 21  U.S.C. section 360bbb-3(b)(1), unless the authorization is terminated or revoked sooner. Performed at [facility], [place] [address] [place], [place] [address]          Radiology Studies: Ct Chest Wo Contrast  Result Date: 05/14/2019 CLINICAL DATA:  Shortness of breath, history of asthma EXAM: CT CHEST WITHOUT CONTRAST TECHNIQUE: Multidetector CT imaging of the chest was performed following the standard protocol without IV contrast. COMPARISON:  CTA chest 08/05/2017, CT neck 05/13/2019 FINDINGS: Cardiovascular: Borderline cardiomegaly. Left atrial enlargement. Three-vessel coronary artery disease. Trace pericardial effusion. Calcifications of the aortic arch. Shared origin of the left common carotid and brachiocephalic artery with tortuosity of the upper brachiocephalic vasculature. Aortic arch is normal caliber. Central pulmonary arteries are normal caliber. Luminal evaluation is precluded in the absence of  contrast. Mediastinum/Nodes: Few prominent low-attenuation upper mediastinal left axillary lymph nodes, likely reactive with adjacent lower left cervical adenopathy, swelling and skin thickening compatible with history of recent endarterectomy. No pneumomediastinum or hemo mediastinum. Axillary adenopathy is difficult to assess in the absence of contrast media. No acute abnormality of the trachea or esophagus. Lungs/Pleura: There are small bilateral effusions, left greater than right. Basilar areas of airless lung are noted with associated volume loss. Some mild tree-in-bud opacity is noted periphery of the right lower lobe which suggest an underlying infectious process. Mild airways thickening is present elsewhere in the lungs. Small amount of fluid is seen tracking along the fissures. Upper Abdomen: Upper abdominal atherosclerotic calcification. Mild nonspecific perinephric stranding is seen bilaterally. No acute suspicious abnormality is seen in the upper abdomen. Musculoskeletal: There is a multitude of compression deformities throughout the thoracic spine from T4 through T7 at T11 and L1. Findings are most severe at T7 with greater than 80% height loss anteriorly. Vertebroplasty changes are noted at T4, T5 and T6. Sclerotic posttraumatic deformity of the mid sternum reflecting union of the fracture seen on comparison study. No new fractures or compression deformities are identified. No suspicious osseous lesions. No aggressive soft tissue abnormalities of the chest wall. IMPRESSION: 1. Small bilateral pleural effusions, left greater than right, with bibasilar areas of airless lung with associated volume loss, favored to reflect atelectasis, although superimposed infection cannot be excluded given some tree-in-bud opacity in the right lower lobe suggestive of an underlying infectious process. 2. Postsurgical changes from recent carotid endarterectomy of the left lower neck with adjacent lower left cervical  adenopathy, swelling and skin thickening, and likely reactive upper mediastinal and axillary nodes. 3. Multitude of compression deformities throughout the thoracic spine, most severe at T7 with greater than 80% height loss anteriorly. Vertebroplasty changes at T4, T5 and T6. Compression deformities and focal kyphosis of the upper thoracic spine are unchanged from comparison 4. Healed posttraumatic deformity of the mid sternum. 5. Aortic Atherosclerosis (ICD10-I70.0). Electronically Signed   By: Lovena Le M.D.   On: 05/14/2019 16:23   Nm Pulmonary Perfusion  Result Date: 05/13/2019 CLINICAL DATA:  Short of breath. EXAM: NUCLEAR MEDICINE PERFUSION LUNG SCAN TECHNIQUE: Perfusion images were obtained in multiple projections after intravenous injection of radiopharmaceutical. Ventilation scans intentionally deferred if perfusion scan and chest x-ray adequate for interpretation during COVID 19 epidemic. RADIOPHARMACEUTICALS:  1.56 mCi Tc-79m MAA IV COMPARISON:  Chest radiograph 05/12/2019 FINDINGS: No segmental perfusion defects identified suspicious for acute pulmonary embolus. IMPRESSION: Negative for acute pulmonary embolus. Electronically Signed   By: Kerby Moors M.D.   On: 05/13/2019 10:12        Scheduled Meds: . aspirin EC  81 mg Oral Daily  . colesevelam  1,875 mg Oral BID WC  . docusate sodium  100 mg Oral BID  . enoxaparin (LOVENOX) injection  30 mg Subcutaneous Q24H  . fluticasone  2 spray Each Nare Daily  . hydrALAZINE  10 mg Oral BID  . insulin aspart  0-5 Units Subcutaneous QHS  . insulin aspart  0-9 Units Subcutaneous TID WC  . magnesium oxide  400 mg Oral BID  . methadone  5 mg Oral Q8H  . mirtazapine  30 mg Oral QHS  . mometasone-formoterol  2 puff Inhalation BID  . tamsulosin  0.4 mg Oral Daily   Continuous Infusions:   LOS: 1 day    Time spent: 30-35 min    Ezekiel Slocumb, DO [facility] Pager: [phone]  If 7PM-7AM, please contact night-coverage  [email or web address] Password [email or web address] 05/15/2019, 8:12 AM

## 2019-05-15 NOTE — Evaluation (Signed)
Clinical/Bedside Swallow Evaluation Patient Details  Name: DHANVI BUCKMAN MRN: [id number] Date of Birth: [birth year]  Today's Date: 05/15/2019 Time: SLP Start Time (ACUTE ONLY): 1604 SLP Stop Time (ACUTE ONLY): 1625 SLP Time Calculation (min) (ACUTE ONLY): 21 min  Past Medical History:  Past Medical History:  Diagnosis Date  . Anemia   . Asthma   . Carotid stenosis, left    CEA 05/11/19  . Diabetes mellitus without complication ([place])   . Diastolic dysfunction   . DOE (dyspnea on exertion)   . Fibromyalgia   . GERD (gastroesophageal reflux disease)   . Heart murmur   . Hypertension   . Mixed hyperlipidemia   . Neuropathy   . RSD (reflex sympathetic dystrophy)    [facility] started on Methadone in 1997  . Sleep apnea    doesn't wear CPAP   Past Surgical History:  Past Surgical History:  Procedure Laterality Date  . ABDOMINAL HYSTERECTOMY    . ANKLE FRACTURE SURGERY    . BACK SURGERY    . CARDIAC CATHETERIZATION  04/27/2019   [place]  . CARPAL TUNNEL RELEASE    . CATARACT EXTRACTION, BILATERAL Bilateral   . COLONOSCOPY    . ENDARTERECTOMY Left 05/11/2019   Procedure: ENDARTERECTOMY CAROTID;  Surgeon: Waynetta Sandy, MD;  Location: [facility];  Service: Vascular;  Laterality: Left;  . FEMUR FRACTURE SURGERY Left 2018   rod and 5 implants  . FRACTURE SURGERY    . IR RADIOLOGIST EVAL & MGMT  01/07/2017  . IR RADIOLOGIST EVAL & MGMT  02/02/2017  . IR VERTEBROPLASTY CERV/THOR BX INC UNI/BIL INC/INJECT/IMAGING  01/14/2017  . IR VERTEBROPLASTY CERV/THOR BX INC UNI/BIL INC/INJECT/IMAGING  03/01/2017  . IR VERTEBROPLASTY EA ADDL (T&LS) BX INC UNI/BIL INC INJECT/IMAGING  01/14/2017  . KNEE ARTHROSCOPY     HPI:  73 year old female status post left carotid endarterectomy on 05/11/2019 by Dr. Donzetta Matters for an asymptomatic high-grade stenosis.  She was discharged yesterday with no significant postop issues on the floor.  She presented back to the ED today with some shortness of breath at  home with wheezing as well as unable to void and having difficulty emptying her bladder. Hx of COPD, with concern for PNA.   Assessment / Plan / Recommendation Clinical Impression  Oral and pharyngeal phases of the swallow appeared grossly functional. No overt s/sx of aspiration evidenced with POs including 3 ounce water challenge. Pt states at baseline episodic sensation of choking on saliva. Vocal quality remained clear throughout PO trials. Pt with prolonged mastication of solids in setting of missing dentition however with increased time was able to tolerate. Pt reports plans for refitting of partials and dental implants. Continue regular thin liquid diet with safe swallow precautions.  SLP Visit Diagnosis: Dysphagia, unspecified (R13.10)    Aspiration Risk  Mild aspiration risk    Diet Recommendation   Regular thin liquids   Medication Administration: Whole meds with liquid    Other  Recommendations Oral Care Recommendations: Oral care BID   Follow up Recommendations        Frequency and Duration            Prognosis        Swallow Study   General Date of Onset: 05/15/19 HPI: 73 year old female status post left carotid endarterectomy on 05/11/2019 by Dr. Donzetta Matters for an asymptomatic high-grade stenosis.  She was discharged yesterday with no significant postop issues on the floor.  She presented back to the ED today with some shortness  of breath at home with wheezing as well as unable to void and having difficulty emptying her bladder. Hx of COPD, with concern for PNA. Type of Study: Bedside Swallow Evaluation Previous Swallow Assessment: none on file Diet Prior to this Study: Regular;Thin liquids Temperature Spikes Noted: No Respiratory Status: Room air History of Recent Intubation: No Behavior/Cognition: Alert;Cooperative;Pleasant mood Oral Cavity Assessment: Within Functional Limits Oral Care Completed by SLP: No Oral Cavity - Dentition: Missing dentition(had dental  implants and partials, though refitting per pt) Vision: Functional for self-feeding Self-Feeding Abilities: Able to feed self Patient Positioning: Upright in bed Baseline Vocal Quality: Normal Volitional Cough: Strong Volitional Swallow: Able to elicit    Oral/Motor/Sensory Function Overall Oral Motor/Sensory Function: Within functional limits   Ice Chips Ice chips: Within functional limits Presentation: Spoon   Thin Liquid Thin Liquid: Within functional limits Presentation: Cup;Straw    Nectar Thick Nectar Thick Liquid: Not tested   Honey Thick Honey Thick Liquid: Not tested   Puree Puree: Within functional limits   Solid     Solid: Within functional limits      Maryalyce Sanjuan E Halle Davlin MA, CCC-SLP Acute Rehabilitation Services  05/15/2019,4:35 PM

## 2019-05-15 NOTE — Plan of Care (Signed)

## 2019-05-15 NOTE — Progress Notes (Addendum)
LATE ENTRY   [place] PA notified of O2 sat desaturation and need for home oxygen, no new orders received from PA, patient given incentive spirometer and educated on how to use it, patient correctly  demonstrated use of incentive spirometer. Patient given instructions to continue to use IS at home and call 911 in case of emergency.

## 2019-05-15 NOTE — Progress Notes (Signed)
PHARMACY NOTE:  ANTIMICROBIAL RENAL DOSAGE ADJUSTMENT  Current antimicrobial regimen includes a mismatch between antimicrobial dosage and estimated renal function.  As per policy approved by the Pharmacy & Therapeutics and Medical Executive Committees, the antimicrobial dosage will be adjusted accordingly.  Current antimicrobial dosage:  Ampicillin/Sulbactam 3gm IV q6h  Indication: Aspiration PNA  Renal Function:  Estimated Creatinine Clearance: 23.8 mL/min (A) (by C-G formula based on SCr of 1.48 mg/dL (H)). []      On intermittent HD, scheduled: []      On CRRT    Antimicrobial dosage has been changed to:  Ampicillin/Sulbactam 3gm IV q12h  Additional comments:   Aydan Phoenix A. Levada Dy, PharmD, BCPS, FNKF Clinical Pharmacist [place] Please utilize Amion for appropriate phone number to reach the unit pharmacist ([place])   05/15/2019 9:43 AM

## 2019-05-16 ENCOUNTER — Encounter (HOSPITAL_COMMUNITY): Payer: Self-pay | Admitting: General Practice

## 2019-05-16 LAB — POCT ACTIVATED CLOTTING TIME: Activated Clotting Time: 235 seconds

## 2019-05-16 LAB — CBC WITH DIFFERENTIAL/PLATELET
Abs Immature Granulocytes: 0.01 10*3/uL (ref 0.00–0.07)
Basophils Absolute: 0 10*3/uL (ref 0.0–0.1)
Basophils Relative: 0 %
Eosinophils Absolute: 0.3 10*3/uL (ref 0.0–0.5)
Eosinophils Relative: 6 %
HCT: 26.9 % — ABNORMAL LOW (ref 36.0–46.0)
Hemoglobin: 8.5 g/dL — ABNORMAL LOW (ref 12.0–15.0)
Immature Granulocytes: 0 %
Lymphocytes Relative: 26 %
Lymphs Abs: 1.3 10*3/uL (ref 0.7–4.0)
MCH: 30.1 pg (ref 26.0–34.0)
MCHC: 31.6 g/dL (ref 30.0–36.0)
MCV: 95.4 fL (ref 80.0–100.0)
Monocytes Absolute: 0.5 10*3/uL (ref 0.1–1.0)
Monocytes Relative: 10 %
Neutro Abs: 2.9 10*3/uL (ref 1.7–7.7)
Neutrophils Relative %: 58 %
Platelets: 173 10*3/uL (ref 150–400)
RBC: 2.82 MIL/uL — ABNORMAL LOW (ref 3.87–5.11)
RDW: 12.6 % (ref 11.5–15.5)
WBC: 5 10*3/uL (ref 4.0–10.5)
nRBC: 0 % (ref 0.0–0.2)

## 2019-05-16 LAB — BASIC METABOLIC PANEL
Anion gap: 8 (ref 5–15)
BUN: 17 mg/dL (ref 8–23)
CO2: 28 mmol/L (ref 22–32)
Calcium: 8.5 mg/dL — ABNORMAL LOW (ref 8.9–10.3)
Chloride: 105 mmol/L (ref 98–111)
Creatinine, Ser: 1.45 mg/dL — ABNORMAL HIGH (ref 0.44–1.00)
GFR calc Af Amer: 41 mL/min — ABNORMAL LOW (ref >60)
GFR calc non Af Amer: 35 mL/min — ABNORMAL LOW (ref >60)
Glucose, Bld: 103 mg/dL — ABNORMAL HIGH (ref 70–99)
Potassium: 4.3 mmol/L (ref 3.5–5.1)
Sodium: 141 mmol/L (ref 135–145)

## 2019-05-16 LAB — GLUCOSE, CAPILLARY
Glucose-Capillary: 117 mg/dL — ABNORMAL HIGH (ref 70–99)
Glucose-Capillary: 182 mg/dL — ABNORMAL HIGH (ref 70–99)

## 2019-05-16 MED ORDER — AMOXICILLIN-POT CLAVULANATE 500-125 MG PO TABS
1.0000 | ORAL_TABLET | Freq: Two times a day (BID) | ORAL | Status: DC
  Filled 2019-05-16 (×3): qty 1

## 2019-05-16 MED ORDER — AMOXICILLIN-POT CLAVULANATE 500-125 MG PO TABS: 1.0000 | ORAL_TABLET | Freq: Two times a day (BID) | ORAL | 0 refills | Status: AC

## 2019-05-16 MED ORDER — BACLOFEN 10 MG PO TABS: 10.0000 mg | ORAL_TABLET | Freq: Every day | ORAL | 0 refills | Status: DC | PRN

## 2019-05-16 MED ORDER — GUAIFENESIN-DM 100-10 MG/5ML PO SYRP: 5.0000 mL | ORAL_SOLUTION | ORAL | 0 refills | Status: DC | PRN

## 2019-05-16 MED ORDER — TAMSULOSIN HCL 0.4 MG PO CAPS: 0.4000 mg | ORAL_CAPSULE | Freq: Every day | ORAL | 0 refills | Status: AC

## 2019-05-16 NOTE — Evaluation (Signed)
Physical Therapy Evaluation & Discharge Patient Details Name: Danielle Fuller MRN: [id number] DOB: 07-19-1946 Today's Date: 05/16/2019   History of Present Illness  Pt is a 73 y.o. female s/p recent L carotid endarterectomy (05/11/19), now admitted 05/12/19 with worsening SOB, wheezing and unable to void; suspect PNA. Negative PE. PMH includes COPD, CKD IV, DM2, CHF.    Clinical Impression  Patient evaluated by Physical Therapy with no further acute PT needs identified. PTA, pt independent and lives with family. Today, pt mod indep with use of RW; SpO2 down to 85% on RA with ambulation (see saturations qualifications note). Educ on correct use of IS, pt able to demonstrate. Discussed potential for home O2 needs. All education has been completed and the patient has no further questions. Acute PT is signing off. Thank you for this referral.    Follow Up Recommendations No PT follow up;Supervision - Intermittent    Equipment Recommendations  None recommended by PT    Recommendations for Other Services       Precautions / Restrictions Precautions Precaution Comments: Watch SpO2 Restrictions Weight Bearing Restrictions: No      Mobility  Bed Mobility Overal bed mobility: Independent                Transfers Overall transfer level: Modified independent Equipment used: Rolling walker (2 wheeled)                Ambulation/Gait Ambulation/Gait assistance: Modified independent (Device/Increase time) Gait Distance (Feet): 150 Feet Assistive device: Rolling walker (2 wheeled) Gait Pattern/deviations: Step-through pattern;Decreased stride length;Trunk flexed   Gait velocity interpretation: 1.31 - 2.62 ft/sec, indicative of limited community ambulator General Gait Details: Slow, steady gait mod indep with RW. Difficulty getting reliable pleth reading via pulse ox; SpO2 down to 85% on RA. DOE 2/4, which pt reports much improved since yesterday  Stairs             Wheelchair Mobility    Modified Rankin (Stroke Patients Only)       Balance Overall balance assessment: Needs assistance   Sitting balance-Leahy Scale: Good       Standing balance-Leahy Scale: Fair Standing balance comment: Ambulatory without UE support                             Pertinent Vitals/Pain      Home Living Family/patient expects to be discharged to:: Private residence Living Arrangements: Spouse/significant other;Other relatives Available Help at Discharge: Family;Available 24 hours/day Type of Home: House Home Access: Stairs to enter   [employer] of Steps: 2 Home Layout: One level Home Equipment: Walker - 2 wheels;Tub bench Additional Comments: 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 Husband currently in outpatient PT for TKA    Prior Function Level of Independence: Independent;Independent with assistive device(s)         Comments: Intermittent use of RW, but does not typically use. Not on home O2. Has used tub bench for bathing since femur fx     Hand Dominance        Extremity/Trunk Assessment   Upper Extremity Assessment Upper Extremity Assessment: Overall WFL for tasks assessed    Lower Extremity Assessment Lower Extremity Assessment: Overall WFL for tasks assessed    Cervical / Trunk Assessment Cervical / Trunk Assessment: Kyphotic  Communication   Communication: No difficulties  Cognition Arousal/Alertness: Awake/alert Behavior During Therapy: WFL for tasks  assessed/performed Overall Cognitive Status: Within Functional Limits for tasks assessed                                 General Comments: WFL for simple tasks, some difficulty multi-tasking likely baseline cognition; not formally assessed      General Comments General comments (skin integrity, edema, etc.): Instructed on correct use of pulse ox, pt able to demonstrate,  pulling ~538mL. Educ on potential for home O2 need    Exercises     Assessment/Plan    PT Assessment Patent does not need any further PT services  PT Problem List         PT Treatment Interventions      PT Goals (Current goals can be found in the Care Plan section)  Acute Rehab PT Goals PT Goal Formulation: All assessment and education complete, DC therapy    Frequency     Barriers to discharge        Co-evaluation               AM-PAC PT "6 Clicks" Mobility  Outcome Measure Help needed turning from your back to your side while in a flat bed without using bedrails?: None Help needed moving from lying on your back to sitting on the side of a flat bed without using bedrails?: None Help needed moving to and from a bed to a chair (including a wheelchair)?: None Help needed standing up from a chair using your arms (e.g., wheelchair or bedside chair)?: None Help needed to walk in hospital room?: None Help needed climbing 3-5 steps with a railing? : A Little 6 Click Score: 23    End of Session Equipment Utilized During Treatment: Oxygen Activity Tolerance: Patient tolerated treatment well Patient left: in chair;with call bell/phone within reach Nurse Communication: Mobility status PT Visit Diagnosis: Other abnormalities of gait and mobility (R26.89)    Time: [id number] PT Time Calculation (min) (ACUTE ONLY): 25 min   Charges:   PT Evaluation $PT Eval Low Complexity: 1 Low PT Treatments $Gait Training: 8-22 mins       Mabeline Caras, PT, DPT Acute Rehabilitation Services  Pager [phone] Office [place] 05/16/2019, 11:28 AM

## 2019-05-16 NOTE — Discharge Summary (Signed)
Discharge Summary     Danielle Fuller 21-Feb-1946 73 y.o. female [id number]  Admission Date: 05/11/2019  Discharge Date: 05/12/19  Physician: Dr. Donzetta Matters  Admission Diagnosis: left carotid stenosis  Discharge Day services:    see progress note on day of discharge Physical Exam: Vitals:   05/12/19 1534 05/12/19 1536  BP:    Pulse: 69 70  Resp: 16 (!) 23  Temp:    SpO2: 94% 91%    Hospital Course:  The patient was admitted to the hospital and taken to the operating room on 05/11/2019 and underwent left carotid endarterectomy.  The pt tolerated the procedure well and was transported to the PACU in good condition.   By POD 1, the pt neuro status remained at baseline.  Patient was hyper kalemia with a potassium of 6.2 noted on morning labs.  This was corrected with D5 and insulin as well as albuterol nebulizer treatment.  Patient also had some wheezing however this was felt to be unchanged compared to preoperatively given her COPD and chronic asthma.  She was instructed to follow-up with PCP for potential need for supplemental home oxygen.  She was prescribed 2 to 3 days of narcotic pain medication for continued postoperative pain control.  She will follow-up in office in 2 weeks with Dr. Donzetta Matters.  Discharge instructions were reviewed with the patient and she voiced her understanding.  She was discharged home in stable condition.    Recent Labs    05/15/19 0330 05/16/19 0156  NA 139 141  K 4.2 4.3  CL 101 105  CO2 29 28  GLUCOSE 96 103*  BUN 20 17  CALCIUM 8.6* 8.5*   Recent Labs    05/15/19 0330 05/16/19 0156  WBC 5.0 5.0  HGB 8.6* 8.5*  HCT 26.6* 26.9*  PLT 144* 173   No results for input(s): INR in the last 72 hours.     Discharge Diagnosis:  left carotid stenosis  Secondary Diagnosis: Patient Active Problem List   Diagnosis Date Noted   Hypoxia 05/14/2019   Acute respiratory failure with hypoxia ([place]) 05/13/2019   Postoperative urinary retention  05/13/2019   CKD (chronic kidney disease) stage 4, GFR 15-29 ml/min (HCC) 05/13/2019   Carotid artery stenosis 05/11/2019   Type 2 diabetes mellitus without complication ([place]) 01/60/1093   Diastolic dysfunction 23/55/7322   Asthma 02/54/2706   Acute metabolic encephalopathy 23/76/2831   RSD (reflex sympathetic dystrophy) 02/27/2017   Fibromyalgia 02/27/2017   Past Medical History:  Diagnosis Date   Anemia    Asthma    Carotid stenosis, left    CEA 05/11/19   Diabetes mellitus without complication (HCC)    Diastolic dysfunction    DOE (dyspnea on exertion)    Fibromyalgia    GERD (gastroesophageal reflux disease)    Heart murmur    Hypertension    Mixed hyperlipidemia    Neuropathy    RSD (reflex sympathetic dystrophy)    [facility] started on Methadone in 1997   Sleep apnea    doesn't wear CPAP    Allergies as of 05/12/2019      Reactions   Clarithromycin Palpitations, Other (See Comments)   Levofloxacin Palpitations, Other (See Comments)   Moxifloxacin Palpitations, Other (See Comments)      Medication List    TAKE these medications   Advair Diskus 500-50 MCG/DOSE Aepb Generic drug: Fluticasone-Salmeterol Inhale 1 puff into the lungs 2 (two) times daily.   aspirin EC 81 MG tablet Take 81 mg by mouth  daily.   B-12 Compliance Injection 1000 MCG/ML Kit Generic drug: Cyanocobalamin Inject 1 mL as directed every 30 (thirty) days.   baclofen 20 MG tablet Commonly known as: LIORESAL Take 20 mg by mouth daily as needed for muscle spasms.   colesevelam 625 MG tablet Commonly known as: WELCHOL Take 1,875 mg by mouth 2 (two) times daily with a meal.   diclofenac sodium 1 % Gel Commonly known as: VOLTAREN Apply 2 g topically 4 (four) times daily. What changed:   when to take this  reasons to take this   dicyclomine 20 MG tablet Commonly known as: BENTYL Take 20 mg by mouth daily as needed for spasms.   fluticasone 50 MCG/ACT nasal  spray Commonly known as: FLONASE Place 2 sprays into both nostrils daily.   hydrALAZINE 10 MG tablet Commonly known as: APRESOLINE Take 10 mg by mouth 2 (two) times daily.   magnesium oxide 400 MG tablet Commonly known as: MAG-OX Take 400 mg by mouth 2 (two) times daily.   metFORMIN 500 MG 24 hr tablet Commonly known as: GLUCOPHAGE-XR Take 500 mg by mouth daily as needed (High blood sugar).   methadone 5 MG tablet Commonly known as: DOLOPHINE Take 5 mg by mouth 5 (five) times daily.   mirtazapine 30 MG tablet Commonly known as: REMERON Take 30 mg by mouth at bedtime.   pramipexole 0.125 MG tablet Commonly known as: MIRAPEX Take 0.125 mg by mouth 2 (two) times daily as needed (restless leg).   PRESERVISION AREDS 2+MULTI VIT PO Take 1 capsule by mouth 2 (two) times daily.   promethazine 25 MG tablet Commonly known as: PHENERGAN Take 25 mg by mouth 3 (three) times daily as needed for nausea or vomiting.   traMADol 50 MG tablet Commonly known as: ULTRAM Take 1 tablet (50 mg total) by mouth every 6 (six) hours as needed for moderate pain.        Discharge Instructions:   [facility] Discharge Instructions Carotid Endarterectomy (CEA)  Please refer to the following instructions for your post-procedure care. Your surgeon or physician assistant will discuss any changes with you.  Activity  You are encouraged to walk as much as you can. You can slowly return to normal activities but must avoid strenuous activity and heavy lifting until your doctor tell you it's OK. Avoid activities such as vacuuming or swinging a golf club. You can drive after one week if you are comfortable and you are no longer taking prescription pain medications. It is normal to feel tired for serval weeks after your surgery. It is also normal to have difficulty with sleep habits, eating, and bowel movements after surgery. These will go away with  time.  Bathing/Showering  You may shower after you come home. Do not soak in a bathtub, hot tub, or swim until the incision heals completely.  Incision Care  Shower every day. Clean your incision with mild soap and water. Pat the area dry with a clean towel. You do not need a bandage unless otherwise instructed. Do not apply any ointments or creams to your incision. You may have skin glue on your incision. Do not peel it off. It will come off on its own in about one week. Your incision may feel thickened and raised for several weeks after your surgery. This is normal and the skin will soften over time. For Men Only: It's OK to shave around the incision but do not shave the incision itself for 2  weeks. It is common to have numbness under your chin that could last for several months.  Diet  Resume your normal diet. There are no special food restrictions following this procedure. A low fat/low cholesterol diet is recommended for all patients with vascular disease. In order to heal from your surgery, it is CRITICAL to get adequate nutrition. Your body requires vitamins, minerals, and protein. Vegetables are the best source of vitamins and minerals. Vegetables also provide the perfect balance of protein. Processed food has little nutritional value, so try to avoid this.  Medications  Resume taking all of your medications unless your doctor or physician assistant tells you not to.  If your incision is causing pain, you may take over-the- counter pain relievers such as acetaminophen (Tylenol). If you were prescribed a stronger pain medication, please be aware these medications can cause nausea and constipation.  Prevent nausea by taking the medication with a snack or meal. Avoid constipation by drinking plenty of fluids and eating foods with a high amount of fiber, such as fruits, vegetables, and grains. Do not take Tylenol if you are taking prescription pain medications.  Follow Up  Our office will  schedule a follow up appointment 2-3 weeks following discharge.  Please call us immediately for any of the following conditions  Increased pain, redness, drainage (pus) from your incision site. Fever of 101 degrees or higher. If you should develop stroke (slurred speech, difficulty swallowing, weakness on one side of your body, loss of vision) you should call 911 and go to the nearest emergency room.  Reduce your risk of vascular disease:  Stop smoking. If you would like help call [facility] at 1-800-QUIT-NOW (206)565-6500) or [place] at [phone]. Manage your cholesterol Maintain a desired weight Control your diabetes Keep your blood pressure down  If you have any questions, please call the office at [phone].  Disposition: home  Patient's condition: is Good  Follow up: 1. Dr. Donzetta Matters in 2 weeks.   Dagoberto Ligas, PA-C [facility] [phone]   --- For [facility] Registry use ---   Modified Rankin score at D/C (0-6): 0  IV medication needed for:  1. Hypertension: No 2. Hypotension: No  Post-op Complications: No  1. Post-op CVA or TIA: No  If yes: Event classification (right eye, left eye, right cortical, left cortical, verterobasilar, other):   If yes: Timing of event (intra-op, <6 hrs post-op, >=6 hrs post-op, unknown):   2. CN injury: No  If yes: CN  injuried   3. Myocardial infarction: No  If yes: Dx by (EKG or clinical, Troponin):   4.  CHF: No  5.  Dysrhythmia (new): No  6. Wound infection: No  7. Reperfusion symptoms: No  8. Return to OR: No  If yes: return to OR for (bleeding, neurologic, other CEA incision, other):

## 2019-05-16 NOTE — TOC Initial Note (Addendum)
Transition of Care [facility]) - Initial/Assessment Note    Patient Details  Name: MAKELL LACEWELL MRN: [id number] Date of Birth: July 02, 1946  [facility] CM/SW Contact:    Marilu Favre, RN Phone Number: 05/16/2019, 12:20 PM  Clinical Narrative:                  Spoke to patient at bedside. Active with Dr Laqueta Due, lives at home with husband Yvone Neu, has transportation to appointments.   Explained MD has ordered home oxygen , patient unaware. Ordered home oxygen with Zack with [facility]. Explained to patient [employer] will deliver portable tank here, then rest of DME to home. Patient voiced understanding.  Home Health RN arranged with [facility]  Expected Discharge Plan: Home/Self Care     Patient Goals and CMS Choice Patient states their goals for this hospitalization and ongoing recovery are:: to go home CMS Medicare.gov Compare Post Acute Care list provided to:: Patient Choice offered to / list presented to : Patient  Expected Discharge Plan and Services Expected Discharge Plan: Home/Self Care     Post Acute Care Choice: Durable Medical Equipment Living arrangements for the past 2 months: Single Family Home                 DME Arranged: Oxygen DME Agency: [facility] Date DME Agency Contacted: 05/16/19 Time DME Agency Contacted: 1219 Representative spoke with at DME Agency: [place]: NA          Prior Living Arrangements/Services Living arrangements for the past 2 months: [place] Lives with:: Spouse Patient language and need for interpreter reviewed:: Yes Do you feel safe going back to the place where you live?: Yes      Need for Family Participation in Patient Care: Yes (Comment) Care giver support system in place?: Yes (comment)   Criminal Activity/Legal Involvement Pertinent to Current Situation/Hospitalization: No - Comment as needed  Activities of Daily Living Home Assistive Devices/Equipment: Eyeglasses, [place]ford Rile (specify  type) ADL Screening (condition at time of admission) Patient's cognitive ability adequate to safely complete daily activities?: Yes Is the patient deaf or have difficulty hearing?: No Does the patient have difficulty seeing, even when wearing glasses/contacts?: No Does the patient have difficulty concentrating, remembering, or making decisions?: Yes Patient able to express need for assistance with ADLs?: Yes Does the patient have difficulty dressing or bathing?: No Independently performs ADLs?: Yes (appropriate for developmental age) Does the patient have difficulty walking or climbing stairs?: Yes Weakness of Legs: Both Weakness of Arms/Hands: None  Permission Sought/Granted   Permission granted to share information with : Yes, Verbal Permission Granted     Permission granted to share info w AGENCY: [facility]        Emotional Assessment Appearance:: Appears stated age Attitude/Demeanor/Rapport: Engaged Affect (typically observed): Accepting Orientation: : Oriented to Self, Oriented to Place, Oriented to  Time, Oriented to Situation Alcohol / Substance Use: Not Applicable Psych Involvement: No (comment)  Admission diagnosis:  Acute respiratory failure with hypoxia ([place]) [J96.01] Postoperative urinary retention [id number], R33.8] Patient Active Problem List   Diagnosis Date Noted  . Hypoxia 05/14/2019  . Acute respiratory failure with hypoxia ([place]) 05/13/2019  . Postoperative urinary retention 05/13/2019  . CKD (chronic kidney disease) stage 4, GFR 15-29 ml/min (HCC) 05/13/2019  . Carotid artery stenosis 05/11/2019  . Type 2 diabetes mellitus without complication ([place]) [id number]  . Diastolic dysfunction Q000111Q  . Asthma 08/17/2017  . Acute metabolic encephalopathy Q000111Q  . RSD (  reflex sympathetic dystrophy) 02/27/2017  . Fibromyalgia 02/27/2017   PCP:  Ernestene Kiel, MD Pharmacy:   [id number] - [place], [place]  [address] [place] [address] Phone: [phone] Fax: [place], [place] [address] Phone: [phone] Fax: [phone]     Social Determinants of Health (SDOH) Interventions    Readmission Risk Interventions No flowsheet data found.

## 2019-05-16 NOTE — Progress Notes (Signed)
SATURATION QUALIFICATIONS: (This note is used to comply with regulatory documentation for home oxygen)  Patient Saturations on Room Air at Rest = 91%  Patient Saturations on Room Air while Ambulating = 85%  Patient Saturations on 2 Liters of oxygen while Ambulating = 92%  Please briefly explain why patient needs home oxygen: Pt requires supplemental oxygen to maintain SpO2 >/88% with mobility.  Mabeline Caras, PT, DPT Acute Rehabilitation Services  Pager [phone] Office [phone]

## 2019-05-16 NOTE — Progress Notes (Signed)
Patient discharged to home with O2 machine, O2 tank and discharge instructions.

## 2019-05-17 NOTE — Discharge Summary (Signed)
Physician Discharge Summary  Danielle Fuller:[id number] DOB: [birth year] DOA: 05/12/2019  PCP: Danielle Kiel, MD  Admit date: 05/12/2019 Discharge date: 05/17/2019  Admitted From: Home Disposition:  Home  Recommendations for Outpatient Follow-up:  1. Follow up with PCP in 1-2 weeks 2. Please obtain BMP/CBC in one week 3. Vascular follow up for recent CEA as previously arranged   Home Health: No Equipment/Devices: Oxygen 2L/min with activity  Discharge Condition: Stable CODE STATUS: Full Diet recommendation: Heart Healthy  Brief/Interim Summary:  Danielle A Norrisis an 73 y.o.femalewith medical history significant ofOSA not on CPAP; reflex sympathetic dystrophy on Methadone; HLD; HTN; DM; and diastolic dysfunction presenting with SOB.She had L CEA on 10/22. She stayed overnight and her husband took her home yesterday. She ate and got sick and felt unable to walk. Her chest was red and swollen. It looked like her chest "had air in it or something," She was feeling SOB with wheezing and this is a little better now. She just "got so sick", she couldn't breathe.   Patient does not require supplemental oxygen at baseline, but does have a history of COPD.  CT soft tissue of the neck obtained given history exam and consistent with subcutaneous emphysema, was negative for that or any complications related to her carotid endarterectomy.  VQ scan obtained and negative for PE.  Subsequently CT chest without contrast obtained and showed bibasilar atelectasis and tree-in-bud appearance in the right lower lobe concerning for infection.  Patient started on Unasyn for presumed aspiration pneumonia speech therapy consulted for swallow evaluation and deemed at mild risk for aspiration.  For urinary retention, started Flomax with good urine output during admission, continued short term on discharge.  Patient's oxygen requirement improved after 24 hours of antibiotics.  Initially required 3L/min  at rest.  Prior to discharge, O2 saturations at rest on room air 91%, with ambulation 85%, but improved to 92% with 2L/min.  Discussed with patient use of home oxygen in the short term while recovering from pneumonia.  She was agreeable, and requested to return home as she was feeling much better.  Encouraged patient to continue frequent use of incentive spirometer at home, and she demonstrated proper use.  Transitioned antibiotics to Augmentin, to complete a 7 day course, and set up for home oxygen therapy.  Patient is to closely follow up with PCP.  She is clinically improved and stable for discharge.   Discharge Diagnoses: Principal Problem:   Postoperative urinary retention Active Problems:   RSD (reflex sympathetic dystrophy)   Type 2 diabetes mellitus without complication (HCC)   Diastolic dysfunction   Asthma   Carotid artery stenosis   Acute respiratory failure with hypoxia (HCC)   CKD (chronic kidney disease) stage 4, GFR 15-29 ml/min ([place])   Hypoxia    Discharge Instructions    Discharge Instructions    Call MD for:   Complete by: As directed    Worsening cough or shortness of breath   Call MD for:  temperature >100.4   Complete by: As directed    Diet - low sodium heart healthy   Complete by: As directed    Discharge instructions   Complete by: As directed    Please use oxygen at 2 L/min with activity.  You likely will need to use it for 2-3 weeks. Please see your primary care doctor in 1-2 weeks to re-evaluate if you still need the oxygen. ---------------------------------------------------------------------------------------------------------------------- Continue antibiotics for 6 more days, Augmentin twice daily.  (For Pneumonia) ----------------------------------------------------------------------------------------------------------------------  We started a medicine called Flomax (aka tamsulosin) since you had urine retention after surgery.   Continue taking that  for another week.  After that, if any problems with difficulty urinating, please call you primary care doctor.   Increase activity slowly   Complete by: As directed      Allergies as of 05/16/2019      Reactions   Clarithromycin Palpitations, Other (See Comments)   Levofloxacin Palpitations, Other (See Comments)   Moxifloxacin Palpitations, Other (See Comments)      Medication List    TAKE these medications   Advair Diskus 500-50 MCG/DOSE Aepb Generic drug: Fluticasone-Salmeterol Inhale 1 puff into the lungs 2 (two) times daily.   amoxicillin-clavulanate 500-125 MG tablet Commonly known as: AUGMENTIN Take 1 tablet (500 mg total) by mouth 2 (two) times daily for 6 days.   aspirin EC 81 MG tablet Take 81 mg by mouth daily.   B-12 Compliance Injection 1000 MCG/ML Kit Generic drug: Cyanocobalamin Inject 1 mL as directed every 30 (thirty) days.   baclofen 10 MG tablet Commonly known as: LIORESAL Take 1 tablet (10 mg total) by mouth daily as needed for muscle spasms. What changed:   medication strength  how much to take   colesevelam 625 MG tablet Commonly known as: WELCHOL Take 1,875 mg by mouth 2 (two) times daily with a meal.   diclofenac sodium 1 % Gel Commonly known as: VOLTAREN Apply 2 g topically 4 (four) times daily. What changed:   when to take this  reasons to take this   dicyclomine 20 MG tablet Commonly known as: BENTYL Take 20 mg by mouth daily as needed for spasms.   fluticasone 50 MCG/ACT nasal spray Commonly known as: FLONASE Place 2 sprays into both nostrils daily.   guaiFENesin-dextromethorphan 100-10 MG/5ML syrup Commonly known as: ROBITUSSIN DM Take 5 mLs by mouth every 4 (four) hours as needed for cough.   hydrALAZINE 10 MG tablet Commonly known as: APRESOLINE Take 10 mg by mouth 2 (two) times daily.   magnesium oxide 400 MG tablet Commonly known as: MAG-OX Take 400 mg by mouth 2 (two) times daily.   metFORMIN 500 MG 24 hr  tablet Commonly known as: GLUCOPHAGE-XR Take 500 mg by mouth daily as needed (High blood sugar).   methadone 5 MG tablet Commonly known as: DOLOPHINE Take 5 mg by mouth 5 (five) times daily.   mirtazapine 30 MG tablet Commonly known as: REMERON Take 30 mg by mouth at bedtime.   pramipexole 0.125 MG tablet Commonly known as: MIRAPEX Take 0.125 mg by mouth 2 (two) times daily as needed (restless leg).   PRESERVISION AREDS 2+MULTI VIT PO Take 1 capsule by mouth 2 (two) times daily.   promethazine 25 MG tablet Commonly known as: PHENERGAN Take 25 mg by mouth 3 (three) times daily as needed for nausea or vomiting.   tamsulosin 0.4 MG Caps capsule Commonly known as: FLOMAX Take 1 capsule (0.4 mg total) by mouth daily after breakfast for 7 days.   traMADol 50 MG tablet Commonly known as: ULTRAM Take 1 tablet (50 mg total) by mouth every 6 (six) hours as needed for moderate pain.      Follow-up Information    [facility]   Specialty: Home Health Services Contact information: [address] [place] [address] [phone]          Allergies  Allergen Reactions  . Clarithromycin Palpitations and Other (See Comments)  .  Levofloxacin Palpitations and Other (See Comments)  . Moxifloxacin Palpitations and Other (See Comments)    Consultations:  None   Procedures/Studies: Dg Chest 2 View  Result Date: 05/12/2019 CLINICAL DATA:  Shortness of breath EXAM: CHEST - 2 VIEW COMPARISON:  February 13, 2019 FINDINGS: There is atelectatic change in the left base with small left pleural effusion. The lungs elsewhere are clear. The heart is upper normal in size with pulmonary vascularity normal. There are multiple compression fractures throughout the thoracic and upper lumbar spine. Patient is undergone several midthoracic kyphoplasty procedures. There is increase in kyphosis with osteoporosis. Old healed rib fractures on the left noted. IMPRESSION:  Atelectatic change in left base with small left pleural effusion. Lungs elsewhere clear. Heart upper normal in size. Bones osteoporotic with multiple thoracic compression fractures and evidence of kyphoplasty procedures at several levels. Old healed rib fractures on the left noted. Electronically Signed   By: Lowella Grip III M.D.   On: 05/12/2019 22:01   Ct Soft Tissue Neck Wo Contrast  Result Date: 05/13/2019 CLINICAL DATA:  Initial evaluation for acute shortness of breath, recent carotid endarterectomy. EXAM: CT NECK WITHOUT CONTRAST TECHNIQUE: Multidetector CT imaging of the neck was performed following the standard protocol without intravenous contrast. COMPARISON:  Prior study from 04/06/2019. FINDINGS: Pharynx and larynx: Oral cavity within normal limits without discrete mass or collection. No acute abnormality about the remaining dentition. Oropharynx and nasopharynx within normal limits. Parapharyngeal fat maintained. Small layering retropharyngeal effusion, likely postoperative related to recent endarterectomy. Epiglottis normal. Vallecula clear. Remainder of the hypopharynx and supraglottic larynx within normal limits. True cords symmetric and normal. Subglottic airway clear. Salivary glands: Salivary glands including the parotid and submandibular glands within normal limits. Thyroid: Subcentimeter hypodensity noted at the lower pole the left thyroid, of doubtful significance given size and patient age. Subcentimeter calcification noted within the lower pole the right thyroid. Thyroid otherwise unremarkable. Lymph nodes: No pathologically enlarged lymph nodes seen within the neck. Vascular: Moderate atherosclerotic change about the aortic arch. Postoperative changes from recent left carotid endarterectomy are seen, with multiple surgical clips about the left bifurcation. Adjacent soft tissue emphysema with stranding and swelling within the left neck, consistent with postoperative changes. No  frank hematoma or other complication identified on this noncontrast examination. Bulky calcified plaque noted about the right bifurcation. Additional scattered atherosclerotic change noted within the carotid siphons. Limited intracranial: Unremarkable. Visualized orbits: Patient status post bilateral ocular lens replacement. Globes orbital soft tissues demonstrate no acute finding. Mastoids and visualized paranasal sinuses: Minimal mucosal thickening noted within the right maxillary sinus. Visualized paranasal sinuses are otherwise clear. Mastoid air cells and middle ear cavities are well pneumatized and free of fluid. Skeleton: No acute osseous abnormality. No discrete lytic or blastic osseous lesions. Multiple chronic compression deformities noted within the upper thoracic spine, several of which demonstrates sequelae of prior vertebral augmentation. Secondary exaggeration of the normal upper thoracic kyphosis. Moderate cervical spondylosis noted at C5-6. Upper chest: Small layering bilateral pleural effusions partially visualized. Visualized lungs are otherwise grossly clear. Visualized upper chest demonstrates no other acute finding. Other: None. IMPRESSION: 1. Postoperative changes from recent left carotid endarterectomy with expected postoperative changes within the adjacent left neck. No discrete hematoma or other complication identified on this noncontrast examination. 2. Small layering bilateral pleural effusions. 3. Multiple old compression fractures within the visualized upper thoracic spine, several of which demonstrate changes of prior vertebral augmentation. Electronically Signed   By: Jeannine Boga M.D.   On:  05/13/2019 06:43   Ct Chest Wo Contrast  Result Date: 05/14/2019 CLINICAL DATA:  Shortness of breath, history of asthma EXAM: CT CHEST WITHOUT CONTRAST TECHNIQUE: Multidetector CT imaging of the chest was performed following the standard protocol without IV contrast. COMPARISON:  CTA  chest 08/05/2017, CT neck 05/13/2019 FINDINGS: Cardiovascular: Borderline cardiomegaly. Left atrial enlargement. Three-vessel coronary artery disease. Trace pericardial effusion. Calcifications of the aortic arch. Shared origin of the left common carotid and brachiocephalic artery with tortuosity of the upper brachiocephalic vasculature. Aortic arch is normal caliber. Central pulmonary arteries are normal caliber. Luminal evaluation is precluded in the absence of contrast. Mediastinum/Nodes: Few prominent low-attenuation upper mediastinal left axillary lymph nodes, likely reactive with adjacent lower left cervical adenopathy, swelling and skin thickening compatible with history of recent endarterectomy. No pneumomediastinum or hemo mediastinum. Axillary adenopathy is difficult to assess in the absence of contrast media. No acute abnormality of the trachea or esophagus. Lungs/Pleura: There are small bilateral effusions, left greater than right. Basilar areas of airless lung are noted with associated volume loss. Some mild tree-in-bud opacity is noted periphery of the right lower lobe which suggest an underlying infectious process. Mild airways thickening is present elsewhere in the lungs. Small amount of fluid is seen tracking along the fissures. Upper Abdomen: Upper abdominal atherosclerotic calcification. Mild nonspecific perinephric stranding is seen bilaterally. No acute suspicious abnormality is seen in the upper abdomen. Musculoskeletal: There is a multitude of compression deformities throughout the thoracic spine from T4 through T7 at T11 and L1. Findings are most severe at T7 with greater than 80% height loss anteriorly. Vertebroplasty changes are noted at T4, T5 and T6. Sclerotic posttraumatic deformity of the mid sternum reflecting union of the fracture seen on comparison study. No new fractures or compression deformities are identified. No suspicious osseous lesions. No aggressive soft tissue abnormalities  of the chest wall. IMPRESSION: 1. Small bilateral pleural effusions, left greater than right, with bibasilar areas of airless lung with associated volume loss, favored to reflect atelectasis, although superimposed infection cannot be excluded given some tree-in-bud opacity in the right lower lobe suggestive of an underlying infectious process. 2. Postsurgical changes from recent carotid endarterectomy of the left lower neck with adjacent lower left cervical adenopathy, swelling and skin thickening, and likely reactive upper mediastinal and axillary nodes. 3. Multitude of compression deformities throughout the thoracic spine, most severe at T7 with greater than 80% height loss anteriorly. Vertebroplasty changes at T4, T5 and T6. Compression deformities and focal kyphosis of the upper thoracic spine are unchanged from comparison 4. Healed posttraumatic deformity of the mid sternum. 5. Aortic Atherosclerosis (ICD10-I70.0). Electronically Signed   By: Lovena Le M.D.   On: 05/14/2019 16:23   Nm Pulmonary Perfusion  Result Date: 05/13/2019 CLINICAL DATA:  Short of breath. EXAM: NUCLEAR MEDICINE PERFUSION LUNG SCAN TECHNIQUE: Perfusion images were obtained in multiple projections after intravenous injection of radiopharmaceutical. Ventilation scans intentionally deferred if perfusion scan and chest x-ray adequate for interpretation during COVID 19 epidemic. RADIOPHARMACEUTICALS:  1.56 mCi Tc-22mMAA IV COMPARISON:  Chest radiograph 05/12/2019 FINDINGS: No segmental perfusion defects identified suspicious for acute pulmonary embolus. IMPRESSION: Negative for acute pulmonary embolus. Electronically Signed   By: TKerby MoorsM.D.   On: 05/13/2019 10:12     Subjective: Patient seen, sitting up in the bed.  Reports feeling "almost 100% better" today.  Denies fever/chills, chest pain or any recurrent shortness or breath or wheezing.     Discharge Exam: Vitals:   05/16/19 1007  05/16/19 1409  BP:  (!) 146/91   Pulse:  80  Resp:  15  Temp:  98.2 F (36.8 C)  SpO2: 92% 98%   Vitals:   05/16/19 0539 05/16/19 0816 05/16/19 1007 05/16/19 1409  BP: (!) 145/60   (!) 146/91  Pulse: 75   80  Resp: 16   15  Temp: 98 F (36.7 C)   98.2 F (36.8 C)  TempSrc: Oral   Oral  SpO2: 96% 92% 92% 98%  Weight:      Height:        General: Pt is alert, awake, not in acute distress Cardiovascular: RRR, S1/S2 +, no rubs, no gallops Respiratory: diminished at bases but CTA bilaterally, no wheezing, no rhonchi Abdominal: Soft, NT, ND, bowel sounds + Extremities: no edema, no cyanosis    The results of significant diagnostics from this hospitalization (including imaging, microbiology, ancillary and laboratory) are listed below for reference.     Microbiology: Recent Results (from the past 240 hour(s))  Novel Coronavirus, NAA (Hosp order, Send-out to [facility] Lab; TAT 18-24 hrs     Status: None   Collection Time: 05/08/19  2:11 PM   Specimen: Nasopharyngeal Swab; Respiratory  Result Value Ref Range Status   SARS-CoV-2, NAA NOT DETECTED NOT DETECTED Final    Comment: (NOTE) This nucleic acid amplification test was developed and its performance characteristics determined by [employer]. Nucleic acid amplification tests include PCR and TMA. This test has not been FDA cleared or approved. This test has been authorized by FDA under an Emergency Use Authorization (EUA). This test is only authorized for the duration of time the declaration that circumstances exist justifying the authorization of the emergency use of in vitro diagnostic tests for detection of SARS-CoV-2 virus and/or diagnosis of COVID-19 infection under section 564(b)(1) of the Act, 21 U.S.C. 340ZJQ-9(U) (1), unless the authorization is terminated or revoked sooner. When diagnostic testing is negative, the possibility of a false negative result should be considered in the context of a patient's recent exposures and the presence of  clinical signs and symptoms consistent with COVID-19. An individual without symptoms of COVID- 19 and who is not shedding SARS-CoV-2 vi rus would expect to have a negative (not detected) result in this assay. Performed At: [facility] [address] [place], [place] [id number] Rush Farmer MD RF:[id number]    [place]  Final    Comment: Performed at [facility], [place] [address] [place], [place] [address]  Surgical pcr screen     Status: Abnormal   Collection Time: 05/08/19  3:41 PM   Specimen: Nasal Mucosa; Nasal Swab  Result Value Ref Range Status   MRSA, PCR POSITIVE (A) NEGATIVE Final    Comment: RESULT CALLED TO, READ BACK BY AND VERIFIED WITH: K MURRAY RN 05/08/19 1807 JDW    Staphylococcus aureus POSITIVE (A) NEGATIVE Final    Comment: (NOTE) The Xpert SA Assay (FDA approved for NASAL specimens in patients 25 years of age and older), is one component of a comprehensive surveillance program. It is not intended to diagnose infection nor to guide or monitor treatment. Performed at [facility], [place] [address], [place], [place] [address]   SARS CORONAVIRUS 2 (TAT 6-24 HRS) Nasopharyngeal Nasopharyngeal Swab     Status: None   Collection Time: 05/13/19  6:23 AM   Specimen: Nasopharyngeal Swab  Result Value Ref Range Status   SARS Coronavirus 2 NEGATIVE NEGATIVE Final    Comment: (NOTE)  SARS-CoV-2 target nucleic acids are NOT DETECTED. The SARS-CoV-2 RNA is generally detectable in upper and lower respiratory specimens during the acute phase of infection. Negative results do not preclude SARS-CoV-2 infection, do not rule out co-infections with other pathogens, and should not be used as the sole basis for treatment or other patient management decisions. Negative results must be combined with clinical observations, patient history, and epidemiological information. The expected result is Negative. Fact Sheet for  Patients: SugarRoll.be Fact Sheet for Healthcare Providers: https://www.woods-mathews.com/ This test is not yet approved or cleared by the [place] FDA and  has been authorized for detection and/or diagnosis of SARS-CoV-2 by FDA under an Emergency Use Authorization (EUA). This EUA will remain  in effect (meaning this test can be used) for the duration of the COVID-19 declaration under Section 56 4(b)(1) of the Act, 21 U.S.C. section 360bbb-3(b)(1), unless the authorization is terminated or revoked sooner. Performed at [facility], [place] [address] [place], [place] [address]      Labs: BNP (last 3 results) Recent Labs    05/12/19 2203  BNP 26.2   Basic Metabolic Panel: Recent Labs  Lab 05/12/19 1448 05/12/19 2203 05/14/19 0405 05/15/19 0330 05/16/19 0156  NA 145 139 140 139 141  K 4.2 4.3 4.4 4.2 4.3  CL 110 106 104 101 105  CO2 26 23 29 29 28   GLUCOSE 81 150* 110* 96 103*  BUN 30* 29* 26* 20 17  CREATININE 1.82* 1.76* 1.51* 1.48* 1.45*  CALCIUM 8.6* 8.5* 8.4* 8.6* 8.5*   Liver Function Tests: No results for input(s): AST, ALT, ALKPHOS, BILITOT, PROT, ALBUMIN in the last 168 hours. No results for input(s): LIPASE, AMYLASE in the last 168 hours. No results for input(s): AMMONIA in the last 168 hours. CBC: Recent Labs  Lab 05/12/19 0500 05/12/19 2203 05/14/19 0405 05/15/19 0330 05/16/19 0156  WBC 7.0 7.7 4.6 5.0 5.0  NEUTROABS  --   --   --   --  2.9  HGB 8.0* 8.9* 8.1* 8.6* 8.5*  HCT 25.9* 28.2* 25.5* 26.6* 26.9*  MCV 98.5 99.3 98.1 96.0 95.4  PLT 147* 167 128* 144* 173   Cardiac Enzymes: No results for input(s): CKTOTAL, CKMB, CKMBINDEX, TROPONINI in the last 168 hours. BNP: Invalid input(s): POCBNP CBG: Recent Labs  Lab 05/15/19 1156 05/15/19 1712 05/15/19 2220 05/16/19 0751 05/16/19 1201  GLUCAP 122* 244* 121* 117* 182*   D-Dimer No results for input(s): DDIMER in the last 72 hours. Hgb  A1c No results for input(s): HGBA1C in the last 72 hours. Lipid Profile No results for input(s): CHOL, HDL, LDLCALC, TRIG, CHOLHDL, LDLDIRECT in the last 72 hours. Thyroid function studies No results for input(s): TSH, T4TOTAL, T3FREE, THYROIDAB in the last 72 hours.  Invalid input(s): FREET3 Anemia work up No results for input(s): VITAMINB12, FOLATE, FERRITIN, TIBC, IRON, RETICCTPCT in the last 72 hours. Urinalysis    Component Value Date/Time   COLORURINE STRAW (A) 05/08/2019 1540   APPEARANCEUR CLEAR 05/08/2019 1540   LABSPEC 1.011 05/08/2019 1540   PHURINE 5.0 05/08/2019 1540   GLUCOSEU NEGATIVE 05/08/2019 1540   HGBUR NEGATIVE 05/08/2019 [place] 05/08/2019 1540   KETONESUR NEGATIVE 05/08/2019 [place] 05/08/2019 1540   NITRITE NEGATIVE 05/08/2019 1540   LEUKOCYTESUR NEGATIVE 05/08/2019 1540   Sepsis Labs Invalid input(s): PROCALCITONIN,  WBC,  LACTICIDVEN Microbiology Recent Results (from the past 240 hour(s))  Novel Coronavirus, NAA (Hosp order, Send-out to [facility]; TAT 18-24  hrs     Status: None   Collection Time: 05/08/19  2:11 PM   Specimen: Nasopharyngeal Swab; Respiratory  Result Value Ref Range Status   SARS-CoV-2, NAA NOT DETECTED NOT DETECTED Final    Comment: (NOTE) This nucleic acid amplification test was developed and its performance characteristics determined by [employer] and [employer]. Nucleic acid amplification tests include PCR and TMA. This test has not been FDA cleared or approved. This test has been authorized by FDA under an Emergency Use Authorization (EUA). This test is only authorized for the duration of time the declaration that circumstances exist justifying the authorization of the emergency use of in vitro diagnostic tests for detection of SARS-CoV-2 virus and/or diagnosis of COVID-19 infection under section 564(b)(1) of the Act, 21 U.S.C. 638GYK-5(L) (1), unless the authorization is terminated or revoked  sooner. When diagnostic testing is negative, the possibility of a false negative result should be considered in the context of a patient's recent exposures and the presence of clinical signs and symptoms consistent with COVID-19. An individual without symptoms of COVID- 19 and who is not shedding SARS-CoV-2 vi rus would expect to have a negative (not detected) result in this assay. Performed At: [facility] [address] [place], [place] [id number] Rush Farmer MD TJ:[id number]    [place]  Final    Comment: Performed at [facility], [place] [address], [place], [place] [address]  Surgical pcr screen     Status: Abnormal   Collection Time: 05/08/19  3:41 PM   Specimen: Nasal Mucosa; Nasal Swab  Result Value Ref Range Status   MRSA, PCR POSITIVE (A) NEGATIVE Final    Comment: RESULT CALLED TO, READ BACK BY AND VERIFIED WITH: K MURRAY RN 05/08/19 1807 JDW    Staphylococcus aureus POSITIVE (A) NEGATIVE Final    Comment: (NOTE) The Xpert SA Assay (FDA approved for NASAL specimens in patients 81 years of age and older), is one component of a comprehensive surveillance program. It is not intended to diagnose infection nor to guide or monitor treatment. Performed at [facility], [place] [address], [place], [place] [address]   SARS CORONAVIRUS 2 (TAT 6-24 HRS) Nasopharyngeal Nasopharyngeal Swab     Status: None   Collection Time: 05/13/19  6:23 AM   Specimen: Nasopharyngeal Swab  Result Value Ref Range Status   SARS Coronavirus 2 NEGATIVE NEGATIVE Final    Comment: (NOTE) SARS-CoV-2 target nucleic acids are NOT DETECTED. The SARS-CoV-2 RNA is generally detectable in upper and lower respiratory specimens during the acute phase of infection. Negative results do not preclude SARS-CoV-2 infection, do not rule out co-infections with other pathogens, and should not be used as the sole basis for treatment or other patient management  decisions. Negative results must be combined with clinical observations, patient history, and epidemiological information. The expected result is Negative. Fact Sheet for Patients: SugarRoll.be Fact Sheet for Healthcare Providers: https://www.woods-mathews.com/ This test is not yet approved or cleared by the [place] FDA and  has been authorized for detection and/or diagnosis of SARS-CoV-2 by FDA under an Emergency Use Authorization (EUA). This EUA will remain  in effect (meaning this test can be used) for the duration of the COVID-19 declaration under Section 56 4(b)(1) of the Act, 21 U.S.C. section 360bbb-3(b)(1), unless the authorization is terminated or revoked sooner. Performed at [facility], [place] [address] [place], [place] [address]      Time coordinating discharge: Over 30 minutes  SIGNED:  Ezekiel Slocumb, DO [facility] 05/17/2019, 9:11 AM Pager [phone]  If 7PM-7AM, please contact night-coverage [email or web address] Password [email or web address]

## 2019-06-02 ENCOUNTER — Other Ambulatory Visit: Payer: Self-pay

## 2019-06-02 ENCOUNTER — Encounter: Payer: Self-pay | Admitting: Vascular Surgery

## 2019-06-02 ENCOUNTER — Ambulatory Visit (INDEPENDENT_AMBULATORY_CARE_PROVIDER_SITE_OTHER): Payer: Self-pay | Admitting: Vascular Surgery

## 2019-06-02 VITALS — BP 162/71 | HR 84 | Temp 97.7°F | Resp 20 | Ht <= 58 in | Wt 114.0 lb

## 2019-06-02 DIAGNOSIS — I6522 Occlusion and stenosis of left carotid artery: Secondary | ICD-10-CM

## 2019-06-02 NOTE — Progress Notes (Signed)
    Subjective:     Patient ID: Danielle Fuller, female   DOB: Jul 20, 1946, 73 y.o.   MRN: [id number]  HPI 73 year old female follows up after left carotid endarterectomy.  This was done for high-grade asymptomatic disease.  Unfortunately was admitted post procedure for COPD exacerbation and urinary retention.  Now doing much better although still having some shortness of breath.  Overall has recovered well from her procedure.   Review of Systems Shortness of breath    Objective:   Physical Exam Vitals:   06/02/19 0919  BP: (!) 162/71  Pulse: 84  Resp: 20  Temp: 97.7 F (36.5 C)  SpO2: 93%   Awake alert oriented Nonlabored respirations Left neck incision healing well Moving all extremities without limitation Tongue is midline    Assessment:     73 year old female status post left carotid endarterectomy for asymptomatic disease with readmission for COPD exacerbation as well as urinary retention now only having mild shortness of breath.    Plan:     Continue aspirin and WelChol with statin intolerance Follow-up in 9 months with repeat carotid duplex  Whittaker Lenis C. Donzetta Matters, MD [facility] Office: [phone] Pager: [phone]

## 2019-06-06 ENCOUNTER — Other Ambulatory Visit: Payer: Self-pay

## 2019-06-06 DIAGNOSIS — I6522 Occlusion and stenosis of left carotid artery: Secondary | ICD-10-CM

## 2020-01-01 ENCOUNTER — Ambulatory Visit: Payer: Medicare Other | Admitting: Internal Medicine

## 2020-04-05 ENCOUNTER — Ambulatory Visit: Payer: Medicare Other | Admitting: Internal Medicine

## 2020-04-12 ENCOUNTER — Ambulatory Visit: Payer: Medicare Other | Admitting: Internal Medicine

## 2021-06-26 IMAGING — CT CT HEAD W/O CM
1 series · 12 of 14 positions shown, 15 images · non-contrast
Comparison: Neck CT today reported separately. CT head 05/16/2017.
Brain MRI 02/27/2017.

CLINICAL DATA: 72-year-old female with history of hemodynamically
significant left ICA stenosis by [id number] CTA

EXAM:
CT HEAD WITHOUT CONTRAST
TECHNIQUE: Contiguous axial images were obtained from the base of the skull
through the vertex without intravenous contrast.

[Series 4: head w/(date) · axial · 0.43mm/px · z∈[-189,-44]mm · 12 of 35 slices shown, 15 images]
[im 3/35  soft-tissue]
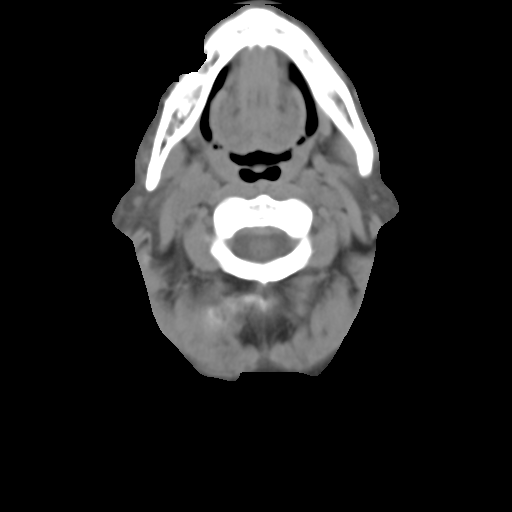
[im 3/35  bone]
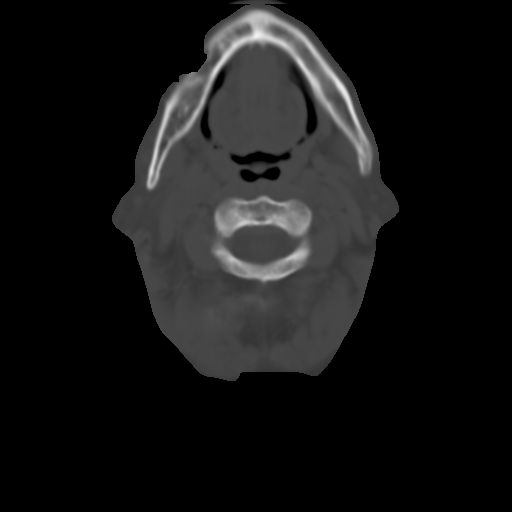
[im 6/35  bone]
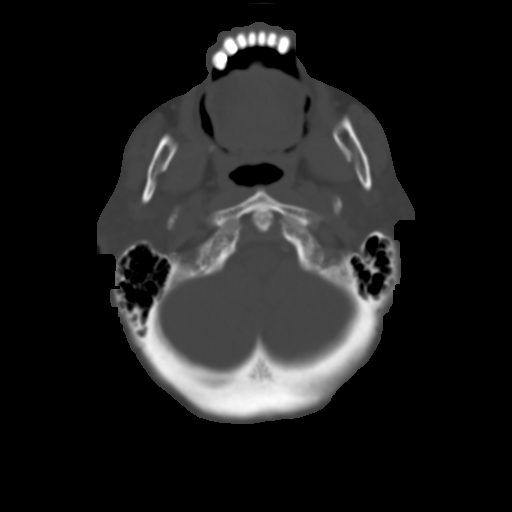
[im 8/35  bone]
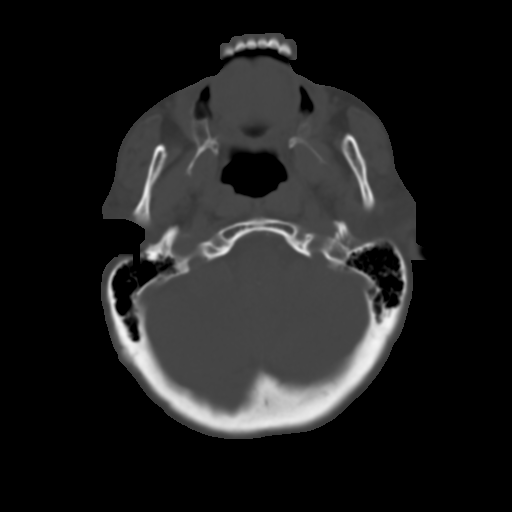
[im 11/35  bone]
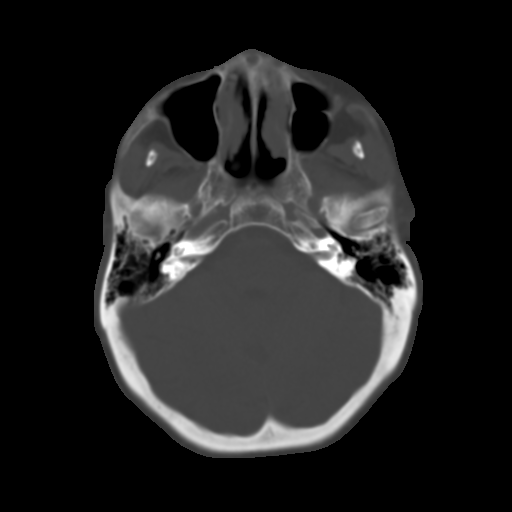
[im 14/35  soft-tissue]
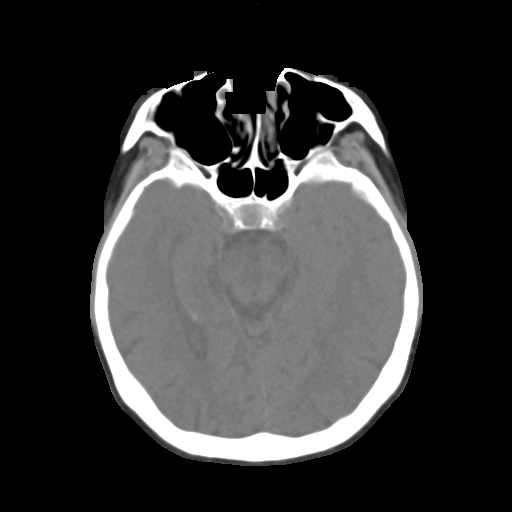
[im 14/35  bone]
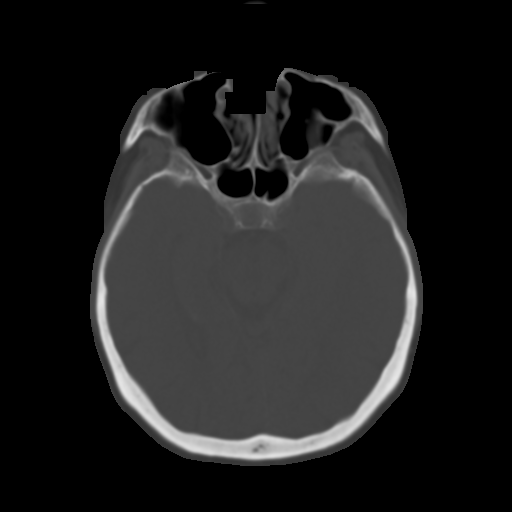
[im 16/35  bone]
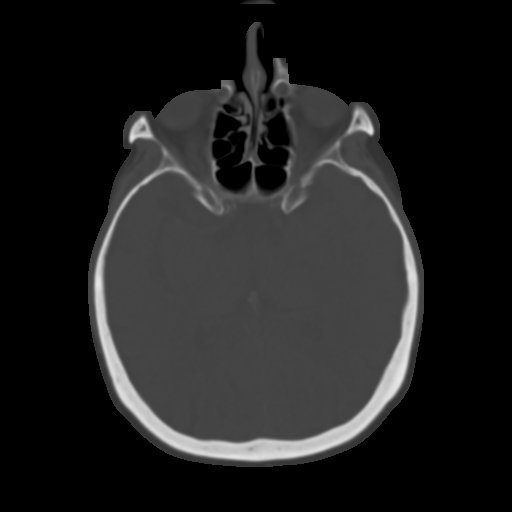
[im 19/35  bone]
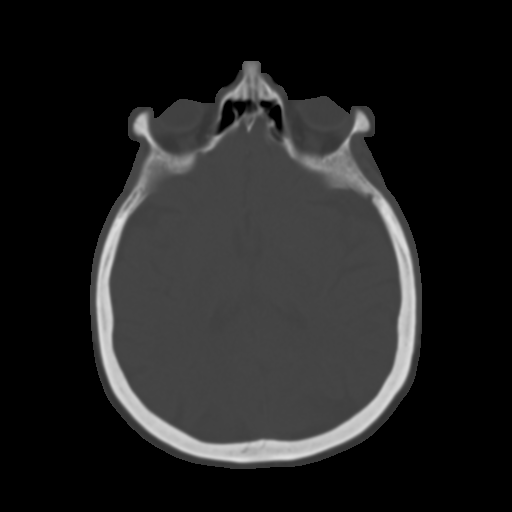
[im 21/35  bone]
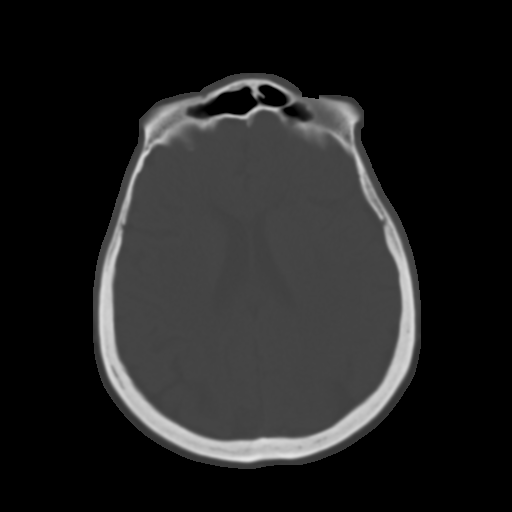
[im 24/35  soft-tissue]
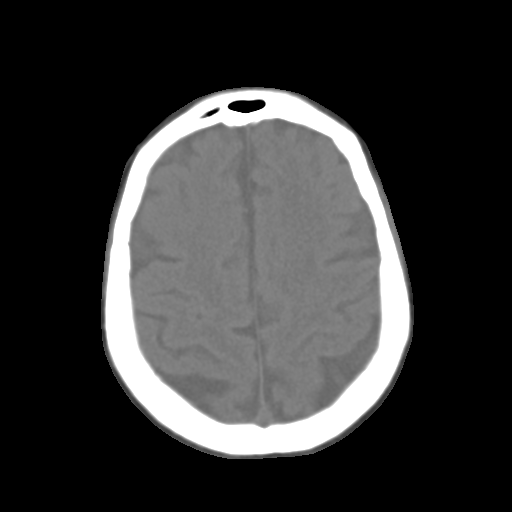
[im 24/35  bone]
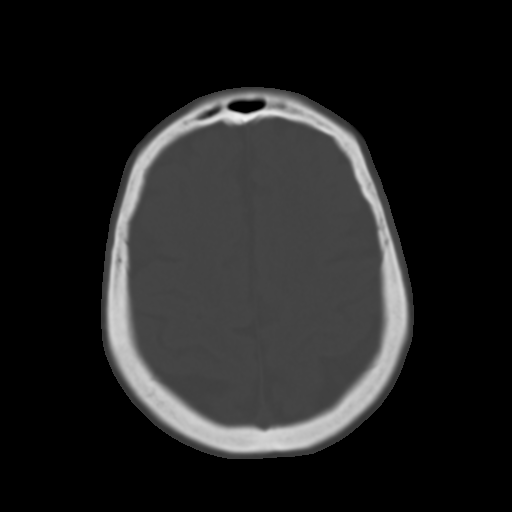
[im 27/35  bone]
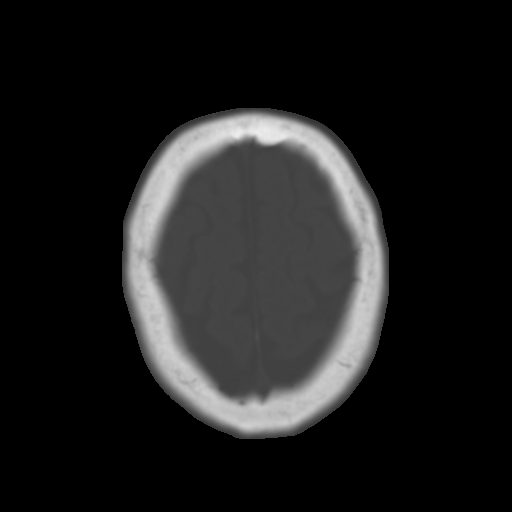
[im 29/35  bone]
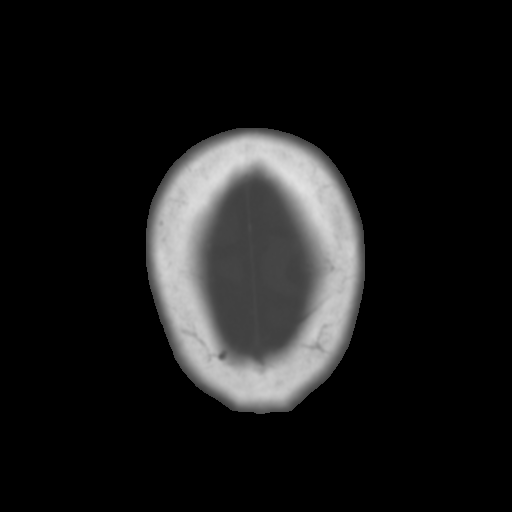
[im 32/35  bone]
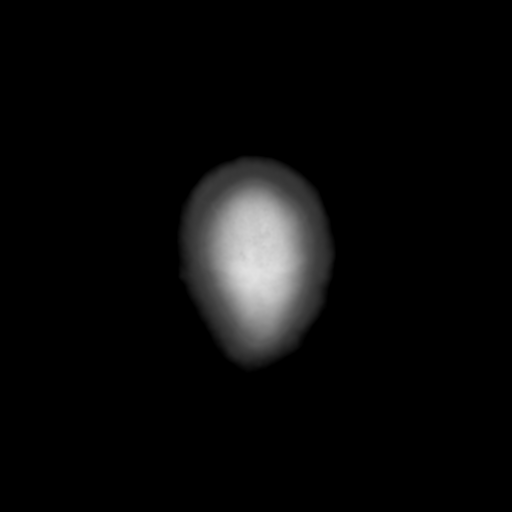

[12 of 14 positions shown; findings below may reference images not displayed]

FINDINGS: Brain: Cerebral volume remains normal for age. No midline shift,
ventriculomegaly, mass effect, evidence of mass lesion, intracranial
hemorrhage or evidence of cortically based acute infarction. No
cortical encephalomalacia identified. Gray-white matter
differentiation is within normal limits for age throughout the
brain.

Vascular: Calcified atherosclerosis at the skull base. Bilateral ICA
siphon calcified atherosclerosis is demonstrated on series 5, image
15. And also on coronal image 38. Lesser distal vertebral artery
left V4 segment plaque on series 5 image 7.

No suspicious intracranial vascular hyperdensity.

Skull: No acute osseous abnormality identified.

Sinuses/Orbits: Visualized paranasal sinuses and mastoids are clear.

Other: No acute orbit or scalp soft tissue findings.
IMPRESSION: 1. Bilateral ICA siphon calcified atherosclerosis. Mild left
vertebral artery V4 segment calcified plaque.
2. Normal for age non contrast CT appearance of the brain.

## 2021-07-02 ENCOUNTER — Ambulatory Visit: Payer: Medicare Other | Admitting: Cardiology

## 2021-08-05 ENCOUNTER — Ambulatory Visit (INDEPENDENT_AMBULATORY_CARE_PROVIDER_SITE_OTHER): Payer: Medicare Other | Admitting: Cardiology

## 2021-08-05 ENCOUNTER — Encounter: Payer: Self-pay | Admitting: Cardiology

## 2021-08-05 ENCOUNTER — Other Ambulatory Visit: Payer: Self-pay

## 2021-08-05 VITALS — BP 132/68 | HR 78 | Ht <= 58 in | Wt 102.0 lb

## 2021-08-05 DIAGNOSIS — I872 Venous insufficiency (chronic) (peripheral): Secondary | ICD-10-CM

## 2021-08-05 DIAGNOSIS — Z9889 Other specified postprocedural states: Secondary | ICD-10-CM | POA: Diagnosis not present

## 2021-08-05 DIAGNOSIS — R011 Cardiac murmur, unspecified: Secondary | ICD-10-CM

## 2021-08-05 DIAGNOSIS — R0989 Other specified symptoms and signs involving the circulatory and respiratory systems: Secondary | ICD-10-CM

## 2021-08-05 NOTE — Patient Instructions (Signed)
Medication Instructions:  Your physician recommends that you continue on your current medications as directed. Please refer to the Current Medication list given to you today.  *If you need a refill on your cardiac medications before your next appointment, please call your pharmacy*   Testing/Procedures: Your physician has requested that you have an echocardiogram. Echocardiography is a painless test that uses sound waves to create images of your heart. It provides your doctor with information about the size and shape of your heart and how well your hearts chambers and valves are working. This procedure takes approximately one hour. There are no restrictions for this procedure. This procedure is done at [address].    Follow-Up: At [facility], you and your health needs are our priority.  As part of our continuing mission to provide you with exceptional heart care, we have created designated Provider Care Teams.  These Care Teams include your primary Cardiologist (physician) and Advanced Practice Providers (APPs -  Physician Assistants and Nurse Practitioners) who all work together to provide you with the care you need, when you need it.  We recommend signing up for the patient portal called "MyChart".  Sign up information is provided on this After Visit Summary.  MyChart is used to connect with patients for Virtual Visits (Telemedicine).  Patients are able to view lab/test results, encounter notes, upcoming appointments, etc.  Non-urgent messages can be sent to your provider as well.   To learn more about what you can do with MyChart, go to [email or web address].    Your next appointment:   12 month(s)  The format for your next appointment:   In Person  Provider:   Berniece Salines, MD  Other Instructions  Echocardiogram An echocardiogram is a test that uses sound waves (ultrasound) to produce images of the heart. Images from an echocardiogram can provide important information  about: Heart size and shape. The size and thickness and movement of your heart's walls. Heart muscle function and strength. Heart valve function or if you have stenosis. Stenosis is when the heart valves are too narrow. If blood is flowing backward through the heart valves (regurgitation). A tumor or infectious growth around the heart valves. Areas of heart muscle that are not working well because of poor blood flow or injury from a heart attack. Aneurysm detection. An aneurysm is a weak or damaged part of an artery wall. The wall bulges out from the normal force of blood pumping through the body. Tell a health care provider about: Any allergies you have. All medicines you are taking, including vitamins, herbs, eye drops, creams, and over-the-counter medicines. Any blood disorders you have. Any surgeries you have had. Any medical conditions you have. Whether you are pregnant or may be pregnant. What are the risks? Generally, this is a safe test. However, problems may occur, including an allergic reaction to dye (contrast) that may be used during the test. What happens before the test? No specific preparation is needed. You may eat and drink normally. What happens during the test?  You will take off your clothes from the waist up and put on a hospital gown. Electrodes or electrocardiogram (ECG)patches may be placed on your chest. The electrodes or patches are then connected to a device that monitors your heart rate and rhythm. You will lie down on a table for an ultrasound exam. A gel will be applied to your chest to help sound waves pass through your skin. A handheld device, called a transducer, will be  pressed against your chest and moved over your heart. The transducer produces sound waves that travel to your heart and bounce back (or "echo" back) to the transducer. These sound waves will be captured in real-time and changed into images of your heart that can be viewed on a video monitor.  The images will be recorded on a computer and reviewed by your health care provider. You may be asked to change positions or hold your breath for a short time. This makes it easier to get different views or better views of your heart. In some cases, you may receive contrast through an IV in one of your veins. This can improve the quality of the pictures from your heart. The procedure may vary among health care providers and hospitals. What can I expect after the test? You may return to your normal, everyday life, including diet, activities, and medicines, unless your health care provider tells you not to do that. Follow these instructions at home: It is up to you to get the results of your test. Ask your health care provider, or the department that is doing the test, when your results will be ready. Keep all follow-up visits. This is important. Summary An echocardiogram is a test that uses sound waves (ultrasound) to produce images of the heart. Images from an echocardiogram can provide important information about the size and shape of your heart, heart muscle function, heart valve function, and other possible heart problems. You do not need to do anything to prepare before this test. You may eat and drink normally. After the echocardiogram is completed, you may return to your normal, everyday life, unless your health care provider tells you not to do that. This information is not intended to replace advice given to you by your health care provider. Make sure you discuss any questions you have with your health care provider. Document Revised: 03/19/2021 Document Reviewed: 02/27/2020 Elsevier Patient Education  2022 [employer].

## 2021-08-05 NOTE — Progress Notes (Signed)
Cardiology Office Note:    Date:  08/07/2021   ID:  Danielle Fuller, DOB Nov 30, 1945, MRN [id number]  PCP:  Ernestene Kiel, MD  Cardiologist:  None  Electrophysiologist:  None   Referring MD: Ernestene Kiel, MD   Chief Complaint  Patient presents with   New Patient (Initial Visit)    History of Present Illness:    Danielle Fuller is a 76 y.o. female with a hx of CAD s/p left carotid endarterectomy.   States that she had a kidney stone, s/p lithotripsy at the end of 2022 (November?). About a week after that, states that her legs started to swell up to her knee. She was started on Tamsulosin. Was told that she should see a cardiologist. States that she was also started on another medication, which she only took 4-5 days which seemed to help the swelling. It made her pee a lot, but she does not recall what the name was. She wears compression socks as well- they are uncomfortable.   SOB with exertion- "when I walk long distances". No chest pain, palpitations, PND, orthopnea, pre-syncope, syncope. No recent falls. Housework is the most active thing she does- states activity is limited by right shoulder pain.  Some dizziness- feels this is related to hydralazine.  States that she bruises easily as well. Has some burning of both legs. States that she is following with Dermatology for some abnormal skin spots.  Socially: Does not smoke, drink or do illicit drug use. Lives with husband, has two children.   Fhx: Denies cardiac family history.   Past Medical History:  Diagnosis Date   Anemia    Asthma    Carotid stenosis, left    CEA 05/11/19   Diabetes mellitus without complication (HCC)    Diastolic dysfunction    DOE (dyspnea on exertion)    Fibromyalgia    GERD (gastroesophageal reflux disease)    Heart murmur    Hypertension    Mixed hyperlipidemia    Neuropathy    RSD (reflex sympathetic dystrophy)    [facility] started on Methadone in 1997   Sleep apnea    doesn't wear CPAP     Past Surgical History:  Procedure Laterality Date   ABDOMINAL HYSTERECTOMY     ANKLE FRACTURE SURGERY     BACK SURGERY     CARDIAC CATHETERIZATION  04/27/2019   [place]   CARPAL TUNNEL RELEASE     CATARACT EXTRACTION, BILATERAL Bilateral    COLONOSCOPY     ENDARTERECTOMY Left 05/11/2019   Procedure: ENDARTERECTOMY CAROTID;  Surgeon: Waynetta Sandy, MD;  Location: [facility];  Service: Vascular;  Laterality: Left;   FEMUR FRACTURE SURGERY Left 2018   rod and 5 implants   FRACTURE SURGERY     IR RADIOLOGIST EVAL & MGMT  01/07/2017   IR RADIOLOGIST EVAL & MGMT  02/02/2017   IR VERTEBROPLASTY CERV/THOR BX INC UNI/BIL INC/INJECT/IMAGING  01/14/2017   IR VERTEBROPLASTY CERV/THOR BX INC UNI/BIL INC/INJECT/IMAGING  03/01/2017   IR VERTEBROPLASTY EA ADDL (T&LS) BX INC UNI/BIL INC INJECT/IMAGING  01/14/2017   KNEE ARTHROSCOPY      Current Medications: Current Meds  Medication Sig   ADVAIR DISKUS 500-50 MCG/DOSE AEPB Inhale 1 puff into the lungs 2 (two) times daily.   aspirin EC 81 MG tablet Take 81 mg by mouth daily.   baclofen (LIORESAL) 10 MG tablet Take 1 tablet (10 mg total) by mouth daily as needed for muscle spasms.   dicyclomine (BENTYL) 20 MG  tablet Take 20 mg by mouth daily as needed for spasms.    fluticasone (FLONASE) 50 MCG/ACT nasal spray Place 2 sprays into both nostrils daily.   guaiFENesin-dextromethorphan (ROBITUSSIN DM) 100-10 MG/5ML syrup Take 5 mLs by mouth every 4 (four) hours as needed for cough.   hydrALAZINE (APRESOLINE) 10 MG tablet Take 10 mg by mouth 2 (two) times daily.   levocetirizine (XYZAL) 5 MG tablet Take 5 mg by mouth every evening.   metFORMIN (GLUCOPHAGE-XR) 500 MG 24 hr tablet Take 500 mg by mouth daily as needed (High blood sugar).    methadone (DOLOPHINE) 5 MG tablet Take 5 mg by mouth 5 (five) times daily.   mirtazapine (REMERON) 30 MG tablet Take 30 mg by mouth at bedtime.   pramipexole (MIRAPEX) 0.125 MG tablet Take 0.125 mg by mouth 2  (two) times daily as needed (restless leg).    promethazine (PHENERGAN) 25 MG tablet Take 25 mg by mouth 3 (three) times daily as needed for nausea or vomiting.   [DISCONTINUED] colesevelam (WELCHOL) 625 MG tablet Take 1,875 mg by mouth 2 (two) times daily with a meal.   [DISCONTINUED] Cyanocobalamin 1000 MCG/ML KIT Inject 1 mL as directed every 30 (thirty) days.   [DISCONTINUED] diclofenac sodium (VOLTAREN) 1 % GEL Apply 2 g topically 4 (four) times daily. (Patient taking differently: Apply 2 g topically 3 (three) times daily as needed (pain).)   [DISCONTINUED] magnesium oxide (MAG-OX) 400 MG tablet Take 400 mg by mouth 2 (two) times daily.   [DISCONTINUED] Multiple Vitamins-Minerals (PRESERVISION AREDS 2+MULTI VIT PO) Take 1 capsule by mouth 2 (two) times daily.   [DISCONTINUED] traMADol (ULTRAM) 50 MG tablet Take 1 tablet (50 mg total) by mouth every 6 (six) hours as needed for moderate pain.     Allergies:   Clarithromycin, Levofloxacin, and Moxifloxacin   Social History   Socioeconomic History   Marital status: Married    Spouse name: Not on file   Number of children: Not on file   Years of education: Not on file   Highest education level: Not on file  Occupational History   Not on file  Tobacco Use   Smoking status: Former    Types: Cigarettes   Smokeless tobacco: Never  Vaping Use   Vaping Use: Never used  Substance and Sexual Activity   Alcohol use: No   Drug use: No   Sexual activity: Not on file  Other Topics Concern   Not on file  Social History Narrative   Not on file   Social Determinants of Health   Financial Resource Strain: Not on file  Food Insecurity: Not on file  Transportation Needs: Not on file  Physical Activity: Not on file  Stress: Not on file  Social Connections: Not on file     Family History: The patient's family history includes Heart disease in her father; Stroke in her mother.  ROS:   Review of Systems  Constitution: Negative for  decreased appetite, fever and weight gain.  HENT: Negative for congestion and sore throat.   Eyes: Negative for discharge, redness. Cardiovascular: Negative for chest pain. Positive for leg swelling,   Respiratory: Positive for non-productive cough, intermittent snoring. Denies hemoptysis, shortness of breath.  Endocrine: Negative for heat intolerance.  Hematologic/Lymphatic:  Positive for bruising easily.  Skin: Negative for flushing, nail changes, rash and suspicious lesions.  Musculoskeletal: Positive for arthritis, joint pain Gastrointestinal: Negative for abdominal pain, bowel incontinence, diarrhea and excessive appetite.  Genitourinary:  No dysuria  Neurological: Negative for focal weakness, headaches Psychiatric/Behavioral: Negative for altered mental status Allergic/Immunologic: Negative for HIV exposure and persistent infections.   EKGs/Labs/Other Studies Reviewed:    The following studies were reviewed today:  EKG:  The ekg ordered today demonstrates   Recent Labs: No results found for requested labs within last 8760 hours.  Recent Lipid Panel No results found for: CHOL, TRIG, HDL, CHOLHDL, VLDL, LDLCALC, LDLDIRECT  Physical Exam:    VS:  BP 132/68 (BP Location: Left Arm, Patient Position: Sitting, Cuff Size: Normal)    Pulse 78    Ht _0  (1.448 m)    Wt 102 lb (46.3 kg)    BMI 22.07 kg/m     Wt Readings from Last 3 Encounters:  08/05/21 102 lb (46.3 kg)  06/02/19 114 lb (51.7 kg)  05/15/19 122 lb 2.2 oz (55.4 kg)    GEN: Well nourished, well developed in no acute distress HEENT: Normal NECK:  slight JVD; No carotid bruits LYMPHATICS: No lymphadenopathy CARDIAC: S1S2 noted, RRR, 2/6 mid-systolic ejection murmur heard right sternal border, no rubs or gallops RESPIRATORY:  Normal effort, crackles up to mid-bases b/l ABDOMEN: Soft, non-tender, non-distended, +bowel sounds, no guarding. EXTREMITIES: Trace edema b/l, No cyanosis, no clubbing MUSCULOSKELETAL:  Moving  all extremities spontaneously  SKIN: Chronic skin changes to b/l shins, venous stasis dermatitis NEUROLOGIC:  Alert and oriented x 3, non-focal PSYCHIATRIC:  Normal affect, good insight  ASSESSMENT:    1. Venous stasis dermatitis of both lower extremities   2. Respiratory crackles at both lung bases   3. Status post carotid endarterectomy   4. Systolic murmur    PLAN:    Patient with b/l trace lower extremity edema with skin changes- concerning for venous stasis dermatitis. Discussed conservative management with compression socks and elevation.  2/6 mid-systolic murmur appreciated. Will also proceed with Echo to assess structure and valves. Reviewed past Echo from 2018 which showed EF 55-60%, severely dilated left atrium, trace aortic regurgitation, mild mitral regurgitation, mild to moderate tricuspid regurgitation. Lung exam with crackles b/l. Patient denies SOB. Could be residual inflammation from recent PNA infection vs pulmonary edema from HF. Echo as above. S/p Endarterectomy in 2020 of left carotid. On ASA 81 mg, but non-adherent daily. On Zetia- has intolerance to statin.  Patient has plans for right shoulder replacement scheduled this month. Reviewed cardiac cath from 2020 which was normal. Will proceed with echo prior to clearance.   Patient seen and examined, note reviewed with the signed resident. I personally reviewed laboratory data, imaging studies and relevant notes. I independently examined the patient and formulated the important aspects of the plan. I have personally discussed the plan with the patient and/or family. Comments or changes to the note/plan are indicated below.  Plan for echocardiogram.  Berniece Salines DO, MS [facility] Attending Cardiologist [place]  [address] [place], [place] [address] [phone] Website: [email or web address]   The patient is in agreement with the above plan. The patient left the office in stable  condition.  The patient will follow up in  Medication Adjustments/Labs and Tests Ordered: Current medicines are reviewed at length with the patient today.  Concerns regarding medicines are outlined above.  Orders Placed This Encounter  Procedures   EKG 12-Lead   ECHOCARDIOGRAM COMPLETE   No orders of the defined types were placed in this encounter.   Patient Instructions  Medication Instructions:  Your physician recommends that you continue on your current medications as  directed. Please refer to the Current Medication list given to you today.  *If you need a refill on your cardiac medications before your next appointment, please call your pharmacy*   Testing/Procedures: Your physician has requested that you have an echocardiogram. Echocardiography is a painless test that uses sound waves to create images of your heart. It provides your doctor with information about the size and shape of your heart and how well your hearts chambers and valves are working. This procedure takes approximately one hour. There are no restrictions for this procedure. This procedure is done at [address].    Follow-Up: At [facility], you and your health needs are our priority.  As part of our continuing mission to provide you with exceptional heart care, we have created designated Provider Care Teams.  These Care Teams include your primary Cardiologist (physician) and Advanced Practice Providers (APPs -  Physician Assistants and Nurse Practitioners) who all work together to provide you with the care you need, when you need it.  We recommend signing up for the patient portal called "MyChart".  Sign up information is provided on this After Visit Summary.  MyChart is used to connect with patients for Virtual Visits (Telemedicine).  Patients are able to view lab/test results, encounter notes, upcoming appointments, etc.  Non-urgent messages can be sent to your provider as well.   To learn more about what  you can do with MyChart, go to [email or web address].    Your next appointment:   12 month(s)  The format for your next appointment:   In Person  Provider:   Berniece Salines, MD  Other Instructions  Echocardiogram An echocardiogram is a test that uses sound waves (ultrasound) to produce images of the heart. Images from an echocardiogram can provide important information about: Heart size and shape. The size and thickness and movement of your heart's walls. Heart muscle function and strength. Heart valve function or if you have stenosis. Stenosis is when the heart valves are too narrow. If blood is flowing backward through the heart valves (regurgitation). A tumor or infectious growth around the heart valves. Areas of heart muscle that are not working well because of poor blood flow or injury from a heart attack. Aneurysm detection. An aneurysm is a weak or damaged part of an artery wall. The wall bulges out from the normal force of blood pumping through the body. Tell a health care provider about: Any allergies you have. All medicines you are taking, including vitamins, herbs, eye drops, creams, and over-the-counter medicines. Any blood disorders you have. Any surgeries you have had. Any medical conditions you have. Whether you are pregnant or may be pregnant. What are the risks? Generally, this is a safe test. However, problems may occur, including an allergic reaction to dye (contrast) that may be used during the test. What happens before the test? No specific preparation is needed. You may eat and drink normally. What happens during the test?  You will take off your clothes from the waist up and put on a hospital gown. Electrodes or electrocardiogram (ECG)patches may be placed on your chest. The electrodes or patches are then connected to a device that monitors your heart rate and rhythm. You will lie down on a table for an ultrasound exam. A gel will be applied to your chest  to help sound waves pass through your skin. A handheld device, called a transducer, will be pressed against your chest and moved over your heart. The transducer produces sound  waves that travel to your heart and bounce back (or "echo" back) to the transducer. These sound waves will be captured in real-time and changed into images of your heart that can be viewed on a video monitor. The images will be recorded on a computer and reviewed by your health care provider. You may be asked to change positions or hold your breath for a short time. This makes it easier to get different views or better views of your heart. In some cases, you may receive contrast through an IV in one of your veins. This can improve the quality of the pictures from your heart. The procedure may vary among health care providers and hospitals. What can I expect after the test? You may return to your normal, everyday life, including diet, activities, and medicines, unless your health care provider tells you not to do that. Follow these instructions at home: It is up to you to get the results of your test. Ask your health care provider, or the department that is doing the test, when your results will be ready. Keep all follow-up visits. This is important. Summary An echocardiogram is a test that uses sound waves (ultrasound) to produce images of the heart. Images from an echocardiogram can provide important information about the size and shape of your heart, heart muscle function, heart valve function, and other possible heart problems. You do not need to do anything to prepare before this test. You may eat and drink normally. After the echocardiogram is completed, you may return to your normal, everyday life, unless your health care provider tells you not to do that. This information is not intended to replace advice given to you by your health care provider. Make sure you discuss any questions you have with your health care  provider. Document Revised: 03/19/2021 Document Reviewed: 02/27/2020 Elsevier Patient Education  2022 [place].     Adopting a Healthy Lifestyle.  Know what a healthy weight is for you (roughly BMI <25) and aim to maintain this   Aim for 7+ servings of fruits and vegetables daily   65-80+ fluid ounces of water or unsweet tea for healthy kidneys   Limit to max 1 drink of alcohol per day; avoid smoking/tobacco   Limit animal fats in diet for cholesterol and heart health - choose grass fed whenever available   Avoid highly processed foods, and foods high in saturated/trans fats   Aim for low stress - take time to unwind and care for your mental health   Aim for 150 min of moderate intensity exercise weekly for heart health, and weights twice weekly for bone health   Aim for 7-9 hours of sleep daily   When it comes to diets, agreement about the perfect plan isnt easy to find, even among the experts. Experts at the [employer] developed an idea known as the Healthy Eating Plate. Just imagine a plate divided into logical, healthy portions.   The emphasis is on diet quality:   Load up on vegetables and fruits - one-half of your plate: Aim for color and variety, and remember that potatoes dont count.   Go for whole grains - one-quarter of your plate: Whole wheat, barley, wheat berries, quinoa, oats, brown rice, and foods made with them. If you want pasta, go with whole wheat pasta.   Protein power - one-quarter of your plate: Fish, chicken, beans, and nuts are all healthy, versatile protein sources. Limit red meat.   The diet, however,  does go beyond the plate, offering a few other suggestions.   Use healthy plant oils, such as olive, canola, soy, corn, sunflower and peanut. Check the labels, and avoid partially hydrogenated oil, which have unhealthy trans fats.   If youre thirsty, drink water. Coffee and tea are good in moderation, but skip sugary drinks  and limit milk and dairy products to one or two daily servings.   The type of carbohydrate in the diet is more important than the amount. Some sources of carbohydrates, such as vegetables, fruits, whole grains, and beans-are healthier than others.   Finally, stay active  Signed, Berniece Salines, DO  08/07/2021 9:51 AM    [place]

## 2021-08-15 ENCOUNTER — Other Ambulatory Visit: Payer: Self-pay

## 2021-08-15 ENCOUNTER — Ambulatory Visit (HOSPITAL_COMMUNITY): Payer: Medicare Other | Attending: Cardiovascular Disease

## 2021-08-15 DIAGNOSIS — R011 Cardiac murmur, unspecified: Secondary | ICD-10-CM | POA: Diagnosis present

## 2021-08-15 LAB — ECHOCARDIOGRAM COMPLETE
Area-P 1/2: 4.06 cm2
S' Lateral: 2.5 cm

## 2021-08-22 ENCOUNTER — Telehealth: Payer: Self-pay | Admitting: Cardiology

## 2021-08-22 NOTE — Telephone Encounter (Signed)
Patient's husband is calling because they have not received the results from patient's echo. Please call to deliver results.

## 2021-08-22 NOTE — Telephone Encounter (Signed)
Spoke to patient's husband echo results given.Advised Dr.Tobb will discuss at next visit.Stated wife does not have appointment until 1 year.Advised I will send message to Dr.Tobb.

## 2021-08-25 NOTE — Telephone Encounter (Signed)
Called pt's husband to let him know the findings are common and at this point there is no need to move up her appt. No answer at this time, left a message for him to return the call.

## 2021-08-26 NOTE — Telephone Encounter (Signed)
Pt returned call to discuss results. Pt states she is to have surgery and wants to make sure everything is okay. Pt made aware the surgical office needs to send  a surgical clearance form to be cleared. She was given the phone and fax number to this office. She verbalized understanding to everything. No further questions at this time.

## 2022-05-04 ENCOUNTER — Encounter: Payer: Medicare Other | Admitting: Oncology

## 2022-05-04 ENCOUNTER — Inpatient Hospital Stay: Payer: Medicare Other | Attending: Oncology

## 2022-06-03 ENCOUNTER — Encounter: Payer: Medicare Other | Admitting: Oncology

## 2022-06-10 ENCOUNTER — Other Ambulatory Visit: Payer: Medicare Other

## 2022-06-10 ENCOUNTER — Encounter: Payer: Medicare Other | Admitting: Oncology

## 2022-06-17 ENCOUNTER — Encounter: Payer: Self-pay | Admitting: Oncology

## 2022-06-17 ENCOUNTER — Inpatient Hospital Stay: Payer: Medicare Other | Attending: Oncology | Admitting: Oncology

## 2022-06-17 ENCOUNTER — Inpatient Hospital Stay: Payer: Medicare Other

## 2022-06-17 VITALS — BP 168/70 | HR 72 | Temp 97.8°F | Resp 18 | Ht <= 58 in | Wt 96.8 lb

## 2022-06-17 DIAGNOSIS — E1122 Type 2 diabetes mellitus with diabetic chronic kidney disease: Secondary | ICD-10-CM | POA: Diagnosis not present

## 2022-06-17 DIAGNOSIS — N184 Chronic kidney disease, stage 4 (severe): Secondary | ICD-10-CM

## 2022-06-17 DIAGNOSIS — I509 Heart failure, unspecified: Secondary | ICD-10-CM | POA: Diagnosis not present

## 2022-06-17 DIAGNOSIS — I130 Hypertensive heart and chronic kidney disease with heart failure and stage 1 through stage 4 chronic kidney disease, or unspecified chronic kidney disease: Secondary | ICD-10-CM | POA: Diagnosis not present

## 2022-06-17 DIAGNOSIS — D539 Nutritional anemia, unspecified: Secondary | ICD-10-CM

## 2022-06-17 DIAGNOSIS — N189 Chronic kidney disease, unspecified: Secondary | ICD-10-CM | POA: Diagnosis not present

## 2022-06-17 DIAGNOSIS — Z7902 Long term (current) use of antithrombotics/antiplatelets: Secondary | ICD-10-CM

## 2022-06-17 DIAGNOSIS — Z87891 Personal history of nicotine dependence: Secondary | ICD-10-CM | POA: Diagnosis not present

## 2022-06-17 DIAGNOSIS — Z90710 Acquired absence of both cervix and uterus: Secondary | ICD-10-CM

## 2022-06-17 DIAGNOSIS — D509 Iron deficiency anemia, unspecified: Secondary | ICD-10-CM | POA: Insufficient documentation

## 2022-06-17 LAB — COMPREHENSIVE METABOLIC PANEL
ALT: 10 U/L (ref 0–44)
AST: 19 U/L (ref 15–41)
Albumin: 3.4 g/dL — ABNORMAL LOW (ref 3.5–5.0)
Alkaline Phosphatase: 66 U/L (ref 38–126)
Anion gap: 8 (ref 5–15)
BUN: 36 mg/dL — ABNORMAL HIGH (ref 8–23)
CO2: 31 mmol/L (ref 22–32)
Calcium: 8.7 mg/dL — ABNORMAL LOW (ref 8.9–10.3)
Chloride: 103 mmol/L (ref 98–111)
Creatinine, Ser: 1.93 mg/dL — ABNORMAL HIGH (ref 0.44–1.00)
GFR, Estimated: 26 mL/min — ABNORMAL LOW (ref >60)
Glucose, Bld: 160 mg/dL — ABNORMAL HIGH (ref 70–99)
Potassium: 4.2 mmol/L (ref 3.5–5.1)
Sodium: 142 mmol/L (ref 135–145)
Total Bilirubin: 0.5 mg/dL (ref 0.3–1.2)
Total Protein: 6.8 g/dL (ref 6.5–8.1)

## 2022-06-17 LAB — VITAMIN B12: Vitamin B-12: 321 pg/mL (ref 180–914)

## 2022-06-17 LAB — CBC WITH DIFFERENTIAL/PLATELET
Abs Immature Granulocytes: 0 10*3/uL (ref 0.00–0.07)
Basophils Absolute: 0 10*3/uL (ref 0.0–0.1)
Basophils Relative: 1 %
Eosinophils Absolute: 0.2 10*3/uL (ref 0.0–0.5)
Eosinophils Relative: 4 %
HCT: 27.3 % — ABNORMAL LOW (ref 36.0–46.0)
Hemoglobin: 8.2 g/dL — ABNORMAL LOW (ref 12.0–15.0)
Immature Granulocytes: 0 %
Lymphocytes Relative: 27 %
Lymphs Abs: 1.1 10*3/uL (ref 0.7–4.0)
MCH: 28 pg (ref 26.0–34.0)
MCHC: 30 g/dL (ref 30.0–36.0)
MCV: 93.2 fL (ref 80.0–100.0)
Monocytes Absolute: 0.3 10*3/uL (ref 0.1–1.0)
Monocytes Relative: 8 %
Neutro Abs: 2.3 10*3/uL (ref 1.7–7.7)
Neutrophils Relative %: 60 %
Platelets: 189 10*3/uL (ref 150–400)
RBC: 2.93 MIL/uL — ABNORMAL LOW (ref 3.87–5.11)
RDW: 14.2 % (ref 11.5–15.5)
WBC: 3.9 10*3/uL — ABNORMAL LOW (ref 4.0–10.5)
nRBC: 0 % (ref 0.0–0.2)

## 2022-06-17 LAB — DIRECT ANTIGLOBULIN TEST (NOT AT ARMC)
DAT, IgG: NEGATIVE
DAT, complement: NEGATIVE

## 2022-06-17 LAB — RETICULOCYTES
Immature Retic Fract: 11.2 % (ref 2.3–15.9)
RBC.: 2.88 MIL/uL — ABNORMAL LOW (ref 3.87–5.11)
Retic Count, Absolute: 38 10*3/uL (ref 19.0–186.0)
Retic Ct Pct: 1.3 % (ref 0.4–3.1)

## 2022-06-17 LAB — FERRITIN: Ferritin: 24 ng/mL (ref 11–307)

## 2022-06-17 LAB — FOLATE: Folate: 10.2 ng/mL (ref >5.9)

## 2022-06-17 NOTE — Progress Notes (Signed)
[facility] Initial Visit:  Patient Care Team: Ernestene Kiel, MD as PCP - General (Internal Medicine)  CHIEF COMPLAINTS/PURPOSE OF CONSULTATION:  Oncology History   No history exists.    HISTORY OF PRESENTING ILLNESS: Danielle Fuller 76 y.o. female is here because of anemia  Medical history notable for CHF, DM Type II, fibromyalgia, osteoporosis, RSD, neuropathy, GERD, hypercalcemia from calcitonin nasal spray, OSA, carotid stenosis, hyperparathyroidism, asthma, hyperlipidemia, CKD  March 17 2022:  Hgb 8.6 Cr 1.84 Glu 113  Hgb A1c  6.4 Ferritin 67  June 17 2022:  [facility] States she has been anemic for a long time.  Not taking oral iron currently but did in the past which was difficult to tolerate due to constipation.  Has not received IV iron/ required PRBC's in the past.   Has a normal diet.  No history postpartum hemorrhage or surgery requiring transfusion.  Having dark stools but no hematochezia, melena, hemoptysis, hematuria.  No history of intra-articular or soft tissue bleeding.  Uses ibuprofen for musculoskeletal pain.  Is not taking oral anticoagulants but does take ASA 81 mg daily.  No history of abnormal bleeding in family members.  Patient has symptoms of fatigue, pallor,  DOE, decreased performance status.  Intolerant to cold. Patient has PICA to ice.   To undergo carotid artery surgery in the future  Had colonoscopy about 5 yrs ago which she states was normal.  Thinks she had an EGD.    Review of Systems - Oncology  MEDICAL HISTORY: Past Medical History:  Diagnosis Date   Anemia    Asthma    Carotid stenosis, left    CEA 05/11/19   Diabetes mellitus without complication (HCC)    Diastolic dysfunction    DOE (dyspnea on exertion)    Fibromyalgia    GERD (gastroesophageal reflux disease)    Heart murmur    Hypertension    Mixed hyperlipidemia    Neuropathy    RSD (reflex sympathetic dystrophy)    [facility] started  on Methadone in 1997   Sleep apnea    doesn't wear CPAP    SURGICAL HISTORY: Past Surgical History:  Procedure Laterality Date   ABDOMINAL HYSTERECTOMY     ANKLE FRACTURE SURGERY     BACK SURGERY     CARDIAC CATHETERIZATION  04/27/2019   [place]   CARPAL TUNNEL RELEASE     CATARACT EXTRACTION, BILATERAL Bilateral    COLONOSCOPY     ENDARTERECTOMY Left 05/11/2019   Procedure: ENDARTERECTOMY CAROTID;  Surgeon: Waynetta Sandy, MD;  Location: [facility];  Service: Vascular;  Laterality: Left;   FEMUR FRACTURE SURGERY Left 2018   rod and 5 implants   FRACTURE SURGERY     IR RADIOLOGIST EVAL & MGMT  01/07/2017   IR RADIOLOGIST EVAL & MGMT  02/02/2017   IR VERTEBROPLASTY CERV/THOR BX INC UNI/BIL INC/INJECT/IMAGING  01/14/2017   IR VERTEBROPLASTY CERV/THOR BX INC UNI/BIL INC/INJECT/IMAGING  03/01/2017   IR VERTEBROPLASTY EA ADDL (T&LS) BX INC UNI/BIL INC INJECT/IMAGING  01/14/2017   KNEE ARTHROSCOPY      SOCIAL HISTORY: Social History   Socioeconomic History   Marital status: Married    Spouse name: Not on file   Number of children: Not on file   Years of education: Not on file   Highest education level: Not on file  Occupational History   Not on file  Tobacco Use   Smoking status: Former    Types: Cigarettes  Smokeless tobacco: Never  Vaping Use   Vaping Use: Never used  Substance and Sexual Activity   Alcohol use: No   Drug use: No   Sexual activity: Not on file  Other Topics Concern   Not on file  Social History Narrative   Not on file   Social Determinants of Health   Financial Resource Strain: Not on file  Food Insecurity: Not on file  Transportation Needs: Not on file  Physical Activity: Not on file  Stress: Not on file  Social Connections: Not on file  Intimate Partner Violence: Not on file    FAMILY HISTORY Family History  Problem Relation Age of Onset   Stroke Mother    Heart disease Father     ALLERGIES:  is allergic to clarithromycin,  levofloxacin, and moxifloxacin.  MEDICATIONS:  Current Outpatient Medications  Medication Sig Dispense Refill   ADVAIR DISKUS 500-50 MCG/DOSE AEPB Inhale 1 puff into the lungs 2 (two) times daily.     aspirin EC 81 MG tablet Take 81 mg by mouth daily.     baclofen (LIORESAL) 10 MG tablet Take 1 tablet (10 mg total) by mouth daily as needed for muscle spasms. 30 each 0   baclofen (LIORESAL) 20 MG tablet Take 20 mg by mouth 3 (three) times daily.     dicyclomine (BENTYL) 20 MG tablet Take 20 mg by mouth daily as needed for spasms.      ezetimibe (ZETIA) 10 MG tablet Take 10 mg by mouth daily.     fluticasone (FLONASE) 50 MCG/ACT nasal spray Place 2 sprays into both nostrils daily.     furosemide (LASIX) 20 MG tablet Take 20 mg by mouth daily as needed.     guaiFENesin-dextromethorphan (ROBITUSSIN DM) 100-10 MG/5ML syrup Take 5 mLs by mouth every 4 (four) hours as needed for cough. 118 mL 0   hydrALAZINE (APRESOLINE) 10 MG tablet Take 10 mg by mouth 2 (two) times daily.     hydrOXYzine (ATARAX) 25 MG tablet Take 25 mg by mouth every 6 (six) hours as needed.     levocetirizine (XYZAL) 5 MG tablet Take 5 mg by mouth every evening.     methadone (DOLOPHINE) 5 MG tablet Take 5 mg by mouth 5 (five) times daily.     mirtazapine (REMERON) 30 MG tablet Take 30 mg by mouth at bedtime.     pioglitazone (ACTOS) 15 MG tablet Take 15 mg by mouth daily.     pramipexole (MIRAPEX) 0.125 MG tablet Take 0.125 mg by mouth 2 (two) times daily as needed (restless leg).      promethazine (PHENERGAN) 25 MG tablet Take 25 mg by mouth 3 (three) times daily as needed for nausea or vomiting.     Vitamin D, Ergocalciferol, (DRISDOL) 1.25 MG (50000 UNIT) CAPS capsule Take 50,000 Units by mouth once a week.     No current facility-administered medications for this visit.    PHYSICAL EXAMINATION:  ECOG PERFORMANCE STATUS: 1 - Symptomatic but completely ambulatory   Vitals:   06/17/22 1532  BP: (!) 168/70  Pulse: 72   Resp: 18  Temp: 97.8 F (36.6 C)  SpO2: 96%    Filed Weights   06/17/22 1532  Weight: 96 lb 12.8 oz (43.9 kg)     Physical Exam Vitals and nursing note reviewed.  Constitutional:      Appearance: Normal appearance. She is normal weight. She is not toxic-appearing or diaphoretic.     Comments: Here alone.  Frail appearing  HENT:     Head: Normocephalic and atraumatic.     Right Ear: External ear normal.     Left Ear: External ear normal.     Nose: Nose normal. No congestion or rhinorrhea.  Eyes:     General: No scleral icterus.    Extraocular Movements: Extraocular movements intact.     Conjunctiva/sclera: Conjunctivae normal.     Pupils: Pupils are equal, round, and reactive to light.  Cardiovascular:     Rate and Rhythm: Normal rate.     Heart sounds: No murmur heard.    No friction rub. No gallop.  Pulmonary:     Effort: Pulmonary effort is normal. No respiratory distress.     Breath sounds: Normal breath sounds. No stridor.  Abdominal:     General: Bowel sounds are normal.     Palpations: Abdomen is soft.     Tenderness: There is no abdominal tenderness. There is no guarding or rebound.  Musculoskeletal:        General: No swelling, tenderness or deformity.     Cervical back: Normal range of motion and neck supple. No rigidity or tenderness.     Right lower leg: No edema.     Left lower leg: No edema.  Lymphadenopathy:     Head:     Right side of head: No submental, submandibular, tonsillar, preauricular, posterior auricular or occipital adenopathy.     Left side of head: No submental, submandibular, tonsillar, preauricular, posterior auricular or occipital adenopathy.     Cervical: No cervical adenopathy.     Right cervical: No superficial, deep or posterior cervical adenopathy.    Left cervical: No superficial, deep or posterior cervical adenopathy.     Upper Body:     Right upper body: No supraclavicular, axillary, pectoral or epitrochlear adenopathy.      Left upper body: No supraclavicular, axillary, pectoral or epitrochlear adenopathy.  Skin:    General: Skin is warm.     Coloration: Skin is pale. Skin is not jaundiced.     Findings: No erythema.  Neurological:     General: No focal deficit present.     Mental Status: She is alert and oriented to person, place, and time. Mental status is at baseline.     Cranial Nerves: No cranial nerve deficit.  Psychiatric:        Mood and Affect: Mood normal.        Behavior: Behavior normal.        Thought Content: Thought content normal.        Judgment: Judgment normal.      LABORATORY DATA: I have personally reviewed the data as listed:  Office Visit on 06/17/2022  Component Date Value Ref Range Status   WBC 06/17/2022 3.9 (L)  4.0 - 10.5 K/uL Final   RBC 06/17/2022 2.93 (L)  3.87 - 5.11 MIL/uL Final   Hemoglobin 06/17/2022 8.2 (L)  12.0 - 15.0 g/dL Final   HCT 06/17/2022 27.3 (L)  36.0 - 46.0 % Final   MCV 06/17/2022 93.2  80.0 - 100.0 fL Final   MCH 06/17/2022 28.0  26.0 - 34.0 pg Final   MCHC 06/17/2022 30.0  30.0 - 36.0 g/dL Final   RDW 06/17/2022 14.2  11.5 - 15.5 % Final   Platelets 06/17/2022 189  150 - 400 K/uL Final   nRBC 06/17/2022 0.0  0.0 - 0.2 % Final   Neutrophils Relative % 06/17/2022 60  % Final   Neutro Abs 06/17/2022 2.3  1.7 -  7.7 K/uL Final   Lymphocytes Relative 06/17/2022 27  % Final   Lymphs Abs 06/17/2022 1.1  0.7 - 4.0 K/uL Final   Monocytes Relative 06/17/2022 8  % Final   Monocytes Absolute 06/17/2022 0.3  0.1 - 1.0 K/uL Final   Eosinophils Relative 06/17/2022 4  % Final   Eosinophils Absolute 06/17/2022 0.2  0.0 - 0.5 K/uL Final   Basophils Relative 06/17/2022 1  % Final   Basophils Absolute 06/17/2022 0.0  0.0 - 0.1 K/uL Final   Immature Granulocytes 06/17/2022 0  % Final   Abs Immature Granulocytes 06/17/2022 0.00  0.00 - 0.07 K/uL Final   Performed at [facility], [place] [address] [place], [place] [address]   Sodium 06/17/2022  142  135 - 145 mmol/L Final   Potassium 06/17/2022 4.2  3.5 - 5.1 mmol/L Final   Chloride 06/17/2022 103  98 - 111 mmol/L Final   CO2 06/17/2022 31  22 - 32 mmol/L Final   Glucose, Bld 06/17/2022 160 (H)  70 - 99 mg/dL Final   Glucose reference range applies only to samples taken after fasting for at least 8 hours.   BUN 06/17/2022 36 (H)  8 - 23 mg/dL Final   Creatinine, Ser 06/17/2022 1.93 (H)  0.44 - 1.00 mg/dL Final   Calcium 06/17/2022 8.7 (L)  8.9 - 10.3 mg/dL Final   Total Protein 06/17/2022 6.8  6.5 - 8.1 g/dL Final   Albumin 06/17/2022 3.4 (L)  3.5 - 5.0 g/dL Final   AST 06/17/2022 19  15 - 41 U/L Final   ALT 06/17/2022 10  0 - 44 U/L Final   Alkaline Phosphatase 06/17/2022 66  38 - 126 U/L Final   Total Bilirubin 06/17/2022 0.5  0.3 - 1.2 mg/dL Final   GFR, Estimated 06/17/2022 26 (L)  >60 mL/min Final   Comment: (NOTE) Calculated using the CKD-EPI Creatinine Equation (2021)    Anion gap 06/17/2022 8  5 - 15 Final   Performed at [facility], [place] [address], [place], [place] [address]  Appointment on 06/17/2022  Component Date Value Ref Range Status   Vitamin B-12 06/17/2022 321  180 - 914 pg/mL Final   Comment: (NOTE) This assay is not validated for testing neonatal or myeloproliferative syndrome specimens for Vitamin B12 levels. Performed at [facility], [place] [address], [place], [place] [address]    Retic Ct Pct 06/17/2022 1.3  0.4 - 3.1 % Final   RBC. 06/17/2022 2.88 (L)  3.87 - 5.11 MIL/uL Final   Retic Count, Absolute 06/17/2022 38.0  19.0 - 186.0 K/uL Final   Immature Retic Fract 06/17/2022 11.2  2.3 - 15.9 % Final   Performed at [facility], [place] [address], [place], [place] [address]   Kappa free light chain 06/17/2022 75.9 (H)  3.3 - 19.4 mg/L Final   Lambda free light chains 06/17/2022 40.6 (H)  5.7 - 26.3 mg/L Final   Kappa, lambda light chain ratio 06/17/2022 1.87 (H)  0.26 - 1.65 Final   Comment:  (NOTE) Performed At: [facility] [place], [place] [id number] Rush Farmer MD [id number]    Haptoglobin 06/17/2022 213  42 - 346 mg/dL Final   Comment: (NOTE) Performed At: [facility] [place], [place] [id number] Rush Farmer MD [id number]    Folate 06/17/2022 10.2  >5.9 ng/mL Final   Performed at [facility], [place] [address], [place], [place] [address]  Ferritin 06/17/2022 24  11 - 307 ng/mL Final   Performed at [place] [address], [place], [place] [address]   DAT, complement 06/17/2022 NEG   Final   DAT, IgG 06/17/2022    Final                   Value:NEG Performed at [facility], [place] [address], [place],  [address]     RADIOGRAPHIC STUDIES: I have personally reviewed the radiological images as listed and agree with the findings in the report  No results found.  ASSESSMENT/PLAN  Patient is a 76 year old  female with symptomatic microcytic anemia presumed to be secondary to chronic blood loss.     Anemia:  Most likely etiology is iron deficiency anemia owing to occult GI bleeding exacerbated by ASA.  CKD is also likely contributing along with chronic illness.  Will obtain CBC with diff, CMP, Ferritin, B12, folate, retic count,  DAT, Haptoglobin, SPEP with IEP, free light chains, Copper and Zinc levels.  Consider Hgb electrophoresis, flow for PNH, thyroid function studies,  evaluation for enzymopathy in the future.   Therapeutics:  Since patient has symptomatic anemia with Hgb < 10  and has not tolerated/been compliant with oral iron will arrange for IV iron replacement.   Given the severe symptoms we prefer to replete iron stores in one or two visits rather than over the course of several months.  In addition ongoing blood loss exceeds the capacity of oral iron to meet needs. A discussion regarding risks was had with the patient.  IV iron has the  potential to cause allergic reactions, including potentially life-threatening anaphylaxis.   IV iron may be associated with non-allergic infusion reactions including self-limiting urticaria, palpitations, dizziness, and neck and back spasm; generally, these occur in <1 percent of individuals and do not progress to more serious reactions. The non-allergic reaction consisting of flushing of the face and myalgias of the chest and back.   After discussion of the risks and benefits of IV iron therapy patient has elected to proceed with parenteral iron therapy.      Cancer Staging  No matching staging information was found for the patient.   No problem-specific Assessment & Plan notes found for this encounter.   Orders Placed This Encounter  Procedures   CBC with Differential/Platelet   Comprehensive metabolic panel   Copper, serum    Standing Status:   Future    Number of Occurrences:   1    Standing Expiration Date:   06/18/2023   Ferritin    Standing Status:   Future    Number of Occurrences:   1    Standing Expiration Date:   06/18/2023   Folate    Standing Status:   Future    Number of Occurrences:   1    Standing Expiration Date:   06/18/2023   Haptoglobin    Standing Status:   Future    Number of Occurrences:   1    Standing Expiration Date:   06/18/2023   Kappa/lambda light chains    Standing Status:   Future    Number of Occurrences:   1    Standing Expiration Date:   06/18/2023   Multiple Myeloma Panel (SPEP&IFE w/QIG)    Standing Status:   Future    Number of Occurrences:   1    Standing Expiration Date:   06/18/2023   Reticulocytes    Standing Status:  Future    Number of Occurrences:   1    Standing Expiration Date:   06/18/2023   Vitamin B12    Standing Status:   Future    Number of Occurrences:   1    Standing Expiration Date:   06/18/2023   Zinc    Standing Status:   Future    Number of Occurrences:   1    Standing Expiration Date:   06/18/2023   Obtain  medical records    Please obtain endoscopy records from Dr. Melina Copa (GI)   Direct antiglobulin test (not at [facility])    Standing Status:   Future    Number of Occurrences:   1    Standing Expiration Date:   06/18/2023    All questions were answered. The patient knows to call the clinic with any problems, questions or concerns.  This note was electronically signed.    Barbee Cough, MD  06/19/2022 2:00 PM

## 2022-06-18 LAB — KAPPA/LAMBDA LIGHT CHAINS
Kappa free light chain: 75.9 mg/L — ABNORMAL HIGH (ref 3.3–19.4)
Kappa, lambda light chain ratio: 1.87 — ABNORMAL HIGH (ref 0.26–1.65)
Lambda free light chains: 40.6 mg/L — ABNORMAL HIGH (ref 5.7–26.3)

## 2022-06-19 ENCOUNTER — Telehealth: Payer: Self-pay | Admitting: Oncology

## 2022-06-19 ENCOUNTER — Encounter: Payer: Self-pay | Admitting: Oncology

## 2022-06-19 DIAGNOSIS — Z7902 Long term (current) use of antithrombotics/antiplatelets: Secondary | ICD-10-CM | POA: Insufficient documentation

## 2022-06-19 LAB — MULTIPLE MYELOMA PANEL, SERUM
Albumin SerPl Elph-Mcnc: 3.3 g/dL (ref 2.9–4.4)
Albumin/Glob SerPl: 1.2 (ref 0.7–1.7)
Alpha 1: 0.3 g/dL (ref 0.0–0.4)
Alpha2 Glob SerPl Elph-Mcnc: 0.7 g/dL (ref 0.4–1.0)
B-Globulin SerPl Elph-Mcnc: 1 g/dL (ref 0.7–1.3)
Gamma Glob SerPl Elph-Mcnc: 1 g/dL (ref 0.4–1.8)
Globulin, Total: 3 g/dL (ref 2.2–3.9)
IgA: 292 mg/dL (ref 64–422)
IgG (Immunoglobin G), Serum: 929 mg/dL (ref 586–1602)
IgM (Immunoglobulin M), Srm: 117 mg/dL (ref 26–217)
Total Protein ELP: 6.3 g/dL (ref 6.0–8.5)

## 2022-06-19 LAB — HAPTOGLOBIN: Haptoglobin: 213 mg/dL (ref 42–346)

## 2022-06-19 NOTE — Telephone Encounter (Signed)
Contacted pt to schedule an IV Iron appts. Unable to reach via phone, voicemail was left.

## 2022-06-22 ENCOUNTER — Telehealth: Payer: Self-pay | Admitting: Oncology

## 2022-06-22 NOTE — Telephone Encounter (Signed)
Pt has been scheduled for IV Iron.

## 2022-06-25 LAB — COPPER, SERUM: Copper: 133 ug/dL (ref 80–158)

## 2022-06-25 LAB — ZINC: Zinc: 73 ug/dL (ref 44–115)

## 2022-06-25 NOTE — Addendum Note (Signed)
Addended by: Juanetta Beets on: 06/25/2022 09:09 AM   Modules accepted: Orders

## 2022-06-26 ENCOUNTER — Ambulatory Visit: Payer: Medicare Other

## 2022-06-29 ENCOUNTER — Ambulatory Visit: Payer: Medicare Other

## 2022-06-29 MED FILL — Ferumoxytol Inj 510 MG/17ML (30 MG/ML) (Elemental Fe): INTRAVENOUS | Qty: 17 | Status: AC

## 2022-06-30 ENCOUNTER — Inpatient Hospital Stay: Payer: Medicare Other | Attending: Oncology

## 2022-06-30 VITALS — BP 143/50 | HR 61 | Temp 98.3°F | Resp 20 | Ht <= 58 in | Wt 95.5 lb

## 2022-06-30 DIAGNOSIS — D539 Nutritional anemia, unspecified: Secondary | ICD-10-CM

## 2022-06-30 DIAGNOSIS — D509 Iron deficiency anemia, unspecified: Secondary | ICD-10-CM | POA: Diagnosis present

## 2022-06-30 MED ORDER — SODIUM CHLORIDE 0.9 % IV SOLN
510.0000 mg | Freq: Once | INTRAVENOUS | Status: AC
  Administered 2022-06-30: 510 mg via INTRAVENOUS
  Filled 2022-06-30: qty 510

## 2022-06-30 MED ORDER — SODIUM CHLORIDE 0.9 % IV SOLN: Freq: Once | INTRAVENOUS | Status: AC

## 2022-06-30 MED ORDER — ACETAMINOPHEN 325 MG PO TABS
650.0000 mg | ORAL_TABLET | Freq: Once | ORAL | Status: AC
  Administered 2022-06-30: 650 mg via ORAL
  Filled 2022-06-30: qty 2

## 2022-06-30 MED ORDER — LORATADINE 10 MG PO TABS
10.0000 mg | ORAL_TABLET | Freq: Every day | ORAL | Status: DC
  Administered 2022-06-30: 10 mg via ORAL
  Filled 2022-06-30: qty 1

## 2022-06-30 NOTE — Patient Instructions (Signed)
Iron Deficiency Anemia, Adult  Iron deficiency anemia is when you do not have enough red blood cells or hemoglobin in your blood. This happens because you have too little iron in your body. Hemoglobin carries oxygen to parts of the body. Anemia can cause your body to not get enough oxygen. What are the causes? Not eating enough foods that have iron in them. The body not being able to take in iron well. Blood loss. What increases the risk? Having menstrual periods. Being pregnant. What are the signs or symptoms? Pale skin, lips, and nails. Weakness, dizziness, and getting tired easily. Feeling like you cannot breathe well when moving (shortness of breath). Cold hands and feet. Mild anemia may not cause any symptoms. How is this treated? This condition is treated by finding out why you do not have enough iron and then getting more iron. It may include: Adding foods to your diet that have a lot of iron. Taking iron pills (supplements). If you are pregnant or breastfeeding, you may need to take extra iron. Your diet often does not provide the amount of iron that you need. Getting more vitamin C in your diet. Vitamin C helps your body take in iron. You may need to take iron pills with a glass of orange juice or vitamin C pills. Medicines to make heavy menstrual periods lighter. Surgery or testing procedures to find what is causing the condition. You may need blood tests to see if treatment is working. If the treatment does not seem to be working, you may need more tests. Follow these instructions at home: Medicines Take over-the-counter and prescription medicines only as told by your doctor. This includes iron pills and vitamins. Taking them as told is important because too much iron can be harmful. Take iron pills when your stomach is empty. If you cannot handle this, take them with food. Do not drink milk or take antacids at the same time as your iron pills. Iron pills may turn your poop  (stool)black. If you cannot handle taking iron pills by mouth, ask your doctor about getting iron through: An IV tube. A shot (injection) into a muscle. Eating and drinking Talk with your doctor before changing the foods you eat. Your doctor may tell you to eat foods that have a lot of iron, such as: Liver. Low-fat (lean) beef. Breads and cereals that have iron added to them. Eggs. Dried fruit. Dark green, leafy vegetables. Eat fresh fruits and vegetables that are high in vitamin C. They help your body use iron. Foods with a lot of vitamin C include: Oranges. Peppers. Tomatoes. Mangoes. Managing constipation If you are taking iron pills, they may cause trouble pooping (constipation). To prevent or treat this, you may need to: Drink enough fluid to keep your pee (urine) pale yellow. Take over-the-counter or prescription medicines. Eat foods that are high in fiber. These include beans, whole grains, and fresh fruits and vegetables. Limit foods that are high in fat and sugar. These include fried or sweet foods. General instructions Return to your normal activities when your doctor says that it is safe. Keep all follow-up visits. Contact a doctor if: You feel like you may vomit (nauseous), or you vomit. You feel weak. You get light-headed when getting up from sitting or lying down. You are sweating for no reason. You have trouble pooping. You have worse breathing with physical activity. You have heaviness in your chest. Get help right away if: You faint. If this happens, do not drive yourself   to the hospital. You have a fast heartbeat, or a heartbeat that does not feel regular. Summary Iron deficiency anemia happens when you have too little iron in your body. This condition is treated by finding out why you do not have enough iron in your body and then getting more iron. Take over-the-counter and prescription medicines only as told by your doctor. Eat fresh fruits and vegetables  that are high in vitamin C. Contact a doctor if you have trouble pooping or feel weak. This information is not intended to replace advice given to you by your health care provider. Make sure you discuss any questions you have with your health care provider. Document Revised: 08/14/2021 Document Reviewed: 08/14/2021 [employer] Patient Education  2023 [employer] Iron-Rich Diet  Iron is a mineral that helps your body produce hemoglobin. Hemoglobin is a protein in red blood cells that carries oxygen to your body's tissues. Eating too little iron may cause you to feel weak and tired, and it can increase your risk of infection. Iron is naturally found in many foods, and many foods have iron added to them (are iron-fortified). You may need to follow an iron-rich diet if you do not have enough iron in your body due to certain medical conditions. The amount of iron that you need each day depends on your age, your sex, and any medical conditions you have. Follow instructions from your health care provider or a dietitian about how much iron you should eat each day. What are tips for following this plan? Reading food labels Check food labels to see how many milligrams (mg) of iron are in each serving. Cooking Cook foods in pots and pans that are made from iron. Take these steps to make it easier for your body to absorb iron from certain foods: Soak beans overnight before cooking. Soak whole grains overnight and drain them before using. Ferment flours before baking, such as by using yeast in bread dough. Meal planning When you eat foods that contain iron, you should eat them with foods that are high in vitamin C. These include oranges, peppers, tomatoes, potatoes, and mangoes. Vitamin C helps your body absorb iron. Certain foods and drinks prevent your body from absorbing iron properly. Avoid eating these foods in the same meal as iron-rich foods or with iron supplements. These foods include: Coffee, black  tea, and red wine. Milk, dairy products, and foods that are high in calcium. Beans and soybeans. Whole grains. General information Take iron supplements only as told by your health care provider. An overdose of iron can be life-threatening. If you were prescribed iron supplements, take them with orange juice or a vitamin C supplement. When you eat iron-fortified foods or take an iron supplement, you should also eat foods that naturally contain iron, such as meat, poultry, and fish. Eating naturally iron-rich foods helps your body absorb the iron that is added to other foods or contained in a supplement. Iron from animal sources is better absorbed than iron from plant sources. What foods should I eat? Fruits Prunes. Raisins. Eat fruits high in vitamin C, such as oranges, grapefruits, and strawberries, with iron-rich foods. Vegetables Spinach (cooked). Green peas. Broccoli. Fermented vegetables. Eat vegetables high in vitamin C, such as leafy greens, potatoes, bell peppers, and tomatoes, with iron-rich foods. Grains Iron-fortified breakfast cereal. Iron-fortified whole-wheat bread. Enriched rice. Sprouted grains. Meats and other proteins Beef liver. Beef. Turkey. Chicken. Oysters. Shrimp. Tuna. Sardines. Chickpeas. Nuts. Tofu. Pumpkin seeds. Beverages Tomato juice. Fresh orange juice.   Prune juice. Hibiscus tea. Iron-fortified instant breakfast shakes. Sweets and desserts Blackstrap molasses. Seasonings and condiments Tahini. Fermented soy sauce. Other foods Wheat germ. The items listed above may not be a complete list of recommended foods and beverages. Contact a dietitian for more information. What foods should I limit? These are foods that should be limited while eating iron-rich foods as they can reduce the absorption of iron in your body. Grains Whole grains. Bran cereal. Bran flour. Meats and other proteins Soybeans. Products made from soy protein. Black beans. Lentils. Mung  beans. Split peas. Dairy Milk. Cream. Cheese. Yogurt. Cottage cheese. Beverages Coffee. Black tea. Red wine. Sweets and desserts Cocoa. Chocolate. Ice cream. Seasonings and condiments Basil. Oregano. Large amounts of parsley. The items listed above may not be a complete list of foods and beverages you should limit. Contact a dietitian for more information. Summary Iron is a mineral that helps your body produce hemoglobin. Hemoglobin is a protein in red blood cells that carries oxygen to your body's tissues. Iron is naturally found in many foods, and many foods have iron added to them (are iron-fortified). When you eat foods that contain iron, you should eat them with foods that are high in vitamin C. Vitamin C helps your body absorb iron. Certain foods and drinks prevent your body from absorbing iron properly, such as whole grains and dairy products. You should avoid eating these foods in the same meal as iron-rich foods or with iron supplements. This information is not intended to replace advice given to you by your health care provider. Make sure you discuss any questions you have with your health care provider. Document Revised: 06/17/2020 Document Reviewed: 06/17/2020 [employer] Patient Education  2023 [employer]  

## 2022-07-01 ENCOUNTER — Ambulatory Visit: Payer: Medicare Other | Admitting: Oncology

## 2022-07-02 ENCOUNTER — Encounter: Payer: Self-pay | Admitting: Oncology

## 2022-07-02 MED FILL — Ferumoxytol Inj 510 MG/17ML (30 MG/ML) (Elemental Fe): INTRAVENOUS | Qty: 17 | Status: AC

## 2022-07-02 NOTE — Addendum Note (Signed)
Addended by: Juanetta Beets on: 07/02/2022 10:17 AM   Modules accepted: Orders

## 2022-07-03 ENCOUNTER — Inpatient Hospital Stay: Payer: Medicare Other

## 2022-07-03 VITALS — BP 148/55 | HR 77 | Temp 98.1°F | Resp 18 | Ht <= 58 in | Wt 97.2 lb

## 2022-07-03 DIAGNOSIS — D509 Iron deficiency anemia, unspecified: Secondary | ICD-10-CM | POA: Diagnosis not present

## 2022-07-03 DIAGNOSIS — D539 Nutritional anemia, unspecified: Secondary | ICD-10-CM

## 2022-07-03 MED ORDER — SODIUM CHLORIDE 0.9 % IV SOLN
510.0000 mg | Freq: Once | INTRAVENOUS | Status: AC
  Administered 2022-07-03: 510 mg via INTRAVENOUS
  Filled 2022-07-03: qty 510

## 2022-07-03 MED ORDER — SODIUM CHLORIDE 0.9 % IV SOLN: Freq: Once | INTRAVENOUS | Status: AC

## 2022-07-03 MED ORDER — LORATADINE 10 MG PO TABS
10.0000 mg | ORAL_TABLET | Freq: Every day | ORAL | Status: DC
  Administered 2022-07-03: 10 mg via ORAL
  Filled 2022-07-03: qty 1

## 2022-07-03 MED ORDER — ACETAMINOPHEN 325 MG PO TABS
650.0000 mg | ORAL_TABLET | Freq: Once | ORAL | Status: AC
  Administered 2022-07-03: 650 mg via ORAL
  Filled 2022-07-03: qty 2

## 2022-07-03 NOTE — Patient Instructions (Signed)

## 2022-07-07 ENCOUNTER — Ambulatory Visit: Payer: Medicare Other | Admitting: Oncology

## 2022-07-07 NOTE — Progress Notes (Deleted)
[facility] Initial Visit:  Patient Care Team: Ernestene Kiel, MD as PCP - General (Internal Medicine)  CHIEF COMPLAINTS/PURPOSE OF CONSULTATION:  Oncology History   No history exists.    HISTORY OF PRESENTING ILLNESS: Danielle Fuller 75 y.o. female is here because of anemia  Medical history notable for CHF, DM Type II, fibromyalgia, osteoporosis, RSD, neuropathy, GERD, hypercalcemia from calcitonin nasal spray, OSA, carotid stenosis, hyperparathyroidism, asthma, hyperlipidemia, CKD  March 17 2022:  Hgb 8.6 Cr 1.84 Glu 113  Hgb A1c  6.4 Ferritin 67  June 17 2022:  [facility] States she has been anemic for a long time.  Not taking oral iron currently but did in the past which was difficult to tolerate due to constipation.  Has not received IV iron/ required PRBC's in the past.   Has a normal diet.  No history postpartum hemorrhage or surgery requiring transfusion.  Having dark stools but no hematochezia, melena, hemoptysis, hematuria.  No history of intra-articular or soft tissue bleeding.  Uses ibuprofen for musculoskeletal pain.  Is not taking oral anticoagulants but does take ASA 81 mg daily.  No history of abnormal bleeding in family members.  Patient has symptoms of fatigue, pallor,  DOE, decreased performance status.  Intolerant to cold. Patient has PICA to ice.   To undergo carotid artery surgery in the future  Had colonoscopy about 5 yrs ago which she states was normal.  Thinks she had an EGD.    Review of Systems - Oncology  MEDICAL HISTORY: Past Medical History:  Diagnosis Date  . Anemia   . Asthma   . Carotid stenosis, left    CEA 05/11/19  . Diabetes mellitus without complication ([place])   . Diastolic dysfunction   . DOE (dyspnea on exertion)   . Fibromyalgia   . GERD (gastroesophageal reflux disease)   . Heart murmur   . Hypertension   . Mixed hyperlipidemia   . Neuropathy   . RSD (reflex sympathetic dystrophy)     [facility] started on Methadone in 1997  . Sleep apnea    doesn't wear CPAP    SURGICAL HISTORY: Past Surgical History:  Procedure Laterality Date  . ABDOMINAL HYSTERECTOMY    . ANKLE FRACTURE SURGERY    . BACK SURGERY    . CARDIAC CATHETERIZATION  04/27/2019   [place]  . CARPAL TUNNEL RELEASE    . CATARACT EXTRACTION, BILATERAL Bilateral   . COLONOSCOPY    . ENDARTERECTOMY Left 05/11/2019   Procedure: ENDARTERECTOMY CAROTID;  Surgeon: Waynetta Sandy, MD;  Location: [facility];  Service: Vascular;  Laterality: Left;  . FEMUR FRACTURE SURGERY Left 2018   rod and 5 implants  . FRACTURE SURGERY    . IR RADIOLOGIST EVAL & MGMT  01/07/2017  . IR RADIOLOGIST EVAL & MGMT  02/02/2017  . IR VERTEBROPLASTY CERV/THOR BX INC UNI/BIL INC/INJECT/IMAGING  01/14/2017  . IR VERTEBROPLASTY CERV/THOR BX INC UNI/BIL INC/INJECT/IMAGING  03/01/2017  . IR VERTEBROPLASTY EA ADDL (T&LS) BX INC UNI/BIL INC INJECT/IMAGING  01/14/2017  . KNEE ARTHROSCOPY      SOCIAL HISTORY: Social History   Socioeconomic History  . Marital status: Married    Spouse name: Not on file  . Number of children: Not on file  . Years of education: Not on file  . Highest education level: Not on file  Occupational History  . Not on file  Tobacco Use  . Smoking status: Former    Types: Cigarettes  .  Smokeless tobacco: Never  Vaping Use  . Vaping Use: Never used  Substance and Sexual Activity  . Alcohol use: No  . Drug use: No  . Sexual activity: Not on file  Other Topics Concern  . Not on file  Social History Narrative  . Not on file   Social Determinants of Health   Financial Resource Strain: Not on file  Food Insecurity: Not on file  Transportation Needs: Not on file  Physical Activity: Not on file  Stress: Not on file  Social Connections: Not on file  Intimate Partner Violence: Not on file    FAMILY HISTORY Family History  Problem Relation Age of Onset  . Stroke Mother   . Heart disease Father      ALLERGIES:  is allergic to clarithromycin, levofloxacin, and moxifloxacin.  MEDICATIONS:  Current Outpatient Medications  Medication Sig Dispense Refill  . ADVAIR DISKUS 500-50 MCG/DOSE AEPB Inhale 1 puff into the lungs 2 (two) times daily.    Marland Kitchen aspirin EC 81 MG tablet Take 81 mg by mouth daily.    . baclofen (LIORESAL) 20 MG tablet Take 20 mg by mouth 3 (three) times daily.    Marland Kitchen dicyclomine (BENTYL) 20 MG tablet Take 20 mg by mouth daily as needed for spasms.     Marland Kitchen ezetimibe (ZETIA) 10 MG tablet Take 10 mg by mouth daily.    . fluticasone (FLONASE) 50 MCG/ACT nasal spray Place 2 sprays into both nostrils daily.    . furosemide (LASIX) 20 MG tablet Take 20 mg by mouth daily as needed.    . hydrALAZINE (APRESOLINE) 10 MG tablet Take 10 mg by mouth 2 (two) times daily.    . hydrOXYzine (ATARAX) 25 MG tablet Take 25 mg by mouth every 6 (six) hours as needed.    Marland Kitchen levocetirizine (XYZAL) 5 MG tablet Take 5 mg by mouth every evening.    . methadone (DOLOPHINE) 5 MG tablet Take 5 mg by mouth 5 (five) times daily.    . mirtazapine (REMERON) 30 MG tablet Take 30 mg by mouth at bedtime.    . pioglitazone (ACTOS) 15 MG tablet Take 15 mg by mouth daily.    . pramipexole (MIRAPEX) 0.125 MG tablet Take 0.125 mg by mouth 2 (two) times daily as needed (restless leg).     . promethazine (PHENERGAN) 25 MG tablet Take 25 mg by mouth 3 (three) times daily as needed for nausea or vomiting.    . Vitamin D, Ergocalciferol, (DRISDOL) 1.25 MG (50000 UNIT) CAPS capsule Take 50,000 Units by mouth once a week.     No current facility-administered medications for this visit.    PHYSICAL EXAMINATION:  ECOG PERFORMANCE STATUS: 1 - Symptomatic but completely ambulatory   There were no vitals filed for this visit.   There were no vitals filed for this visit.    Physical Exam Vitals and nursing note reviewed.  Constitutional:      Appearance: Normal appearance. She is normal weight. She is not  toxic-appearing or diaphoretic.     Comments: Here alone.  Frail appearing  HENT:     Head: Normocephalic and atraumatic.     Right Ear: External ear normal.     Left Ear: External ear normal.     Nose: Nose normal. No congestion or rhinorrhea.  Eyes:     General: No scleral icterus.    Extraocular Movements: Extraocular movements intact.     Conjunctiva/sclera: Conjunctivae normal.     Pupils: Pupils are equal,  round, and reactive to light.  Cardiovascular:     Rate and Rhythm: Normal rate.     Heart sounds: No murmur heard.    No friction rub. No gallop.  Pulmonary:     Effort: Pulmonary effort is normal. No respiratory distress.     Breath sounds: Normal breath sounds. No stridor.  Abdominal:     General: Bowel sounds are normal.     Palpations: Abdomen is soft.     Tenderness: There is no abdominal tenderness. There is no guarding or rebound.  Musculoskeletal:        General: No swelling, tenderness or deformity.     Cervical back: Normal range of motion and neck supple. No rigidity or tenderness.     Right lower leg: No edema.     Left lower leg: No edema.  Lymphadenopathy:     Head:     Right side of head: No submental, submandibular, tonsillar, preauricular, posterior auricular or occipital adenopathy.     Left side of head: No submental, submandibular, tonsillar, preauricular, posterior auricular or occipital adenopathy.     Cervical: No cervical adenopathy.     Right cervical: No superficial, deep or posterior cervical adenopathy.    Left cervical: No superficial, deep or posterior cervical adenopathy.     Upper Body:     Right upper body: No supraclavicular, axillary, pectoral or epitrochlear adenopathy.     Left upper body: No supraclavicular, axillary, pectoral or epitrochlear adenopathy.  Skin:    General: Skin is warm.     Coloration: Skin is pale. Skin is not jaundiced.     Findings: No erythema.  Neurological:     General: No focal deficit present.      Mental Status: She is alert and oriented to person, place, and time. Mental status is at baseline.     Cranial Nerves: No cranial nerve deficit.  Psychiatric:        Mood and Affect: Mood normal.        Behavior: Behavior normal.        Thought Content: Thought content normal.        Judgment: Judgment normal.     LABORATORY DATA: I have personally reviewed the data as listed:  Office Visit on 06/17/2022  Component Date Value Ref Range Status  . WBC 06/17/2022 3.9 (L)  4.0 - 10.5 K/uL Final  . RBC 06/17/2022 2.93 (L)  3.87 - 5.11 MIL/uL Final  . Hemoglobin 06/17/2022 8.2 (L)  12.0 - 15.0 g/dL Final  . HCT 06/17/2022 27.3 (L)  36.0 - 46.0 % Final  . MCV 06/17/2022 93.2  80.0 - 100.0 fL Final  . MCH 06/17/2022 28.0  26.0 - 34.0 pg Final  . MCHC 06/17/2022 30.0  30.0 - 36.0 g/dL Final  . RDW 06/17/2022 14.2  11.5 - 15.5 % Final  . Platelets 06/17/2022 189  150 - 400 K/uL Final  . nRBC 06/17/2022 0.0  0.0 - 0.2 % Final  . Neutrophils Relative % 06/17/2022 60  % Final  . Neutro Abs 06/17/2022 2.3  1.7 - 7.7 K/uL Final  . Lymphocytes Relative 06/17/2022 27  % Final  . Lymphs Abs 06/17/2022 1.1  0.7 - 4.0 K/uL Final  . Monocytes Relative 06/17/2022 8  % Final  . Monocytes Absolute 06/17/2022 0.3  0.1 - 1.0 K/uL Final  . Eosinophils Relative 06/17/2022 4  % Final  . Eosinophils Absolute 06/17/2022 0.2  0.0 - 0.5 K/uL Final  . Basophils Relative 06/17/2022  1  % Final  . Basophils Absolute 06/17/2022 0.0  0.0 - 0.1 K/uL Final  . Immature Granulocytes 06/17/2022 0  % Final  . Abs Immature Granulocytes 06/17/2022 0.00  0.00 - 0.07 K/uL Final   Performed at [facility], [place] [address], [place], [place] [address]  . Sodium 06/17/2022 142  135 - 145 mmol/L Final  . Potassium 06/17/2022 4.2  3.5 - 5.1 mmol/L Final  . Chloride 06/17/2022 103  98 - 111 mmol/L Final  . CO2 06/17/2022 31  22 - 32 mmol/L Final  . Glucose, Bld 06/17/2022 160 (H)  70 - 99 mg/dL Final   Glucose  reference range applies only to samples taken after fasting for at least 8 hours.  . BUN 06/17/2022 36 (H)  8 - 23 mg/dL Final  . Creatinine, Ser 06/17/2022 1.93 (H)  0.44 - 1.00 mg/dL Final  . Calcium 06/17/2022 8.7 (L)  8.9 - 10.3 mg/dL Final  . Total Protein 06/17/2022 6.8  6.5 - 8.1 g/dL Final  . Albumin 06/17/2022 3.4 (L)  3.5 - 5.0 g/dL Final  . AST 06/17/2022 19  15 - 41 U/L Final  . ALT 06/17/2022 10  0 - 44 U/L Final  . Alkaline Phosphatase 06/17/2022 66  38 - 126 U/L Final  . Total Bilirubin 06/17/2022 0.5  0.3 - 1.2 mg/dL Final  . GFR, Estimated 06/17/2022 26 (L)  >60 mL/min Final   Comment: (NOTE) Calculated using the CKD-EPI Creatinine Equation (2021)   . Anion gap 06/17/2022 8  5 - 15 Final   Performed at [facility], [place] [address], [place], [place] [address]  Appointment on 06/17/2022  Component Date Value Ref Range Status  . Zinc 06/17/2022 73  44 - 115 ug/dL Final   Comment: (NOTE) This test was developed and its performance characteristics determined by [facility]. It has not been cleared or approved by the Food and Drug Administration.                                Detection Limit = 5 Performed At: [facility] [address] [place], [place] [id number] Rush Farmer MD [id number]   . Vitamin B-12 06/17/2022 321  180 - 914 pg/mL Final   Comment: (NOTE) This assay is not validated for testing neonatal or myeloproliferative syndrome specimens for Vitamin B12 levels. Performed at [facility], [place] [address] [place], [place] [address]   . Retic Ct Pct 06/17/2022 1.3  0.4 - 3.1 % Final  . RBC. 06/17/2022 2.88 (L)  3.87 - 5.11 MIL/uL Final  . Retic Count, Absolute 06/17/2022 38.0  19.0 - 186.0 K/uL Final  . Immature Retic Fract 06/17/2022 11.2  2.3 - 15.9 % Final   Performed at [facility], [place] [address], [place], [place] [address]  . IgG (Immunoglobin G), Serum 06/17/2022 929  586 - 1,602  mg/dL Final  . IgA 06/17/2022 292  64 - 422 mg/dL Final  . IgM (Immunoglobulin M), Srm 06/17/2022 117  26 - 217 mg/dL Final  . Total Protein ELP 06/17/2022 6.3  6.0 - 8.5 g/dL Corrected  . Albumin SerPl Elph-Mcnc 06/17/2022 3.3  2.9 - 4.4 g/dL Corrected  . Alpha 1 06/17/2022 0.3  0.0 - 0.4 g/dL Corrected  . Alpha2 Glob SerPl Elph-Mcnc 06/17/2022 0.7  0.4 - 1.0 g/dL Corrected  . B-Globulin SerPl Elph-Mcnc 06/17/2022 1.0  0.7 - 1.3  g/dL Corrected  . Gamma Glob SerPl Elph-Mcnc 06/17/2022 1.0  0.4 - 1.8 g/dL Corrected  . M Protein SerPl Elph-Mcnc 06/17/2022 Not Observed  Not Observed g/dL Corrected  . Globulin, Total 06/17/2022 3.0  2.2 - 3.9 g/dL Corrected  . Albumin/Glob SerPl 06/17/2022 1.2  0.7 - 1.7 Corrected  . IFE 1 06/17/2022 Comment   Corrected   Comment: (NOTE) The immunofixation pattern appears unremarkable. Evidence of monoclonal protein is not apparent.   . Please Note 06/17/2022 Comment   Corrected   Comment: (NOTE) Protein electrophoresis scan will follow via computer, mail, or courier delivery. Performed At: [facility] [place], [place] [id number] Rush Farmer MD [id number]:[id number]   . Kappa free light chain 06/17/2022 75.9 (H)  3.3 - 19.4 mg/L Final  . Lambda free light chains 06/17/2022 40.6 (H)  5.7 - 26.3 mg/L Final  . Kappa, lambda light chain ratio 06/17/2022 1.87 (H)  0.26 - 1.65 Final   Comment: (NOTE) Performed At: [facility] [place], [place] [id number] Rush Farmer MD [id number]   . Haptoglobin 06/17/2022 213  42 - 346 mg/dL Final   Comment: (NOTE) Performed At: [facility] [place], [place] [id number] Rush Farmer MD [id number]   . Folate 06/17/2022 10.2  >5.9 ng/mL Final   Performed at [facility] [address], [place], [place] [address]  . Ferritin 06/17/2022 24  11 - 307 ng/mL Final   Performed at [facility] [address] [place], [place] [address]  . DAT, complement 06/17/2022 NEG   Final  . DAT, IgG 06/17/2022    Final                   Value:NEG Performed at [facility], [place] [address] [place],  [address]   . Copper 06/17/2022 133  80 - 158 ug/dL Final   Comment: (NOTE) This test was developed and its performance characteristics determined by [facility]. It has not been cleared or approved by the Food and Drug Administration.                                Detection Limit = 5 Performed At: [facility] [address] [place], [place] [id number] Rush Farmer MD [id number]     RADIOGRAPHIC STUDIES: I have personally reviewed the radiological images as listed and agree with the findings in the report  No results found.  ASSESSMENT/PLAN  Patient is a 76 year old  female with symptomatic microcytic anemia presumed to be secondary to chronic blood loss.     Anemia:  Most likely etiology is iron deficiency anemia owing to occult GI bleeding exacerbated by ASA.  CKD is also likely contributing along with chronic illness.  Will obtain CBC with diff, CMP, Ferritin, B12, folate, retic count,  DAT, Haptoglobin, SPEP with IEP, free light chains, Copper and Zinc levels.  Consider Hgb electrophoresis, flow for PNH, thyroid function studies,  evaluation for enzymopathy in the future.   Therapeutics:  Since patient has symptomatic anemia with Hgb < 10  and has not tolerated/been compliant with oral iron will arrange for IV iron replacement.   Given the severe symptoms we prefer to replete iron stores in one or two visits rather than over the course of several months.  In addition ongoing blood loss exceeds the capacity of oral iron to meet needs. A discussion regarding  risks was had with the patient.  IV iron has the potential to cause allergic reactions, including potentially life-threatening anaphylaxis.   IV iron may be associated with non-allergic infusion reactions  including self-limiting urticaria, palpitations, dizziness, and neck and back spasm; generally, these occur in <1 percent of individuals and do not progress to more serious reactions. The non-allergic reaction consisting of flushing of the face and myalgias of the chest and back.   After discussion of the risks and benefits of IV iron therapy patient has elected to proceed with parenteral iron therapy.      Cancer Staging  No matching staging information was found for the patient.   No problem-specific Assessment & Plan notes found for this encounter.   No orders of the defined types were placed in this encounter.   All questions were answered. The patient knows to call the clinic with any problems, questions or concerns.  This note was electronically signed.    Barbee Cough, MD  07/07/2022 8:44 AM
